# Patient Record
Sex: Male | Born: 1950 | Race: White | Hispanic: No | Marital: Married | Smoking: Never smoker
Health system: Southern US, Community
[De-identification: ages and names within clinical notes are randomized; demographics above are authoritative.]

## PROBLEM LIST (undated history)

## (undated) DIAGNOSIS — Z8719 Personal history of other diseases of the digestive system: Secondary | ICD-10-CM

## (undated) DIAGNOSIS — R569 Unspecified convulsions: Secondary | ICD-10-CM

## (undated) DIAGNOSIS — S4290XA Fracture of unspecified shoulder girdle, part unspecified, initial encounter for closed fracture: Secondary | ICD-10-CM

## (undated) DIAGNOSIS — R7611 Nonspecific reaction to tuberculin skin test without active tuberculosis: Secondary | ICD-10-CM

## (undated) DIAGNOSIS — K5903 Drug induced constipation: Secondary | ICD-10-CM

## (undated) DIAGNOSIS — N400 Enlarged prostate without lower urinary tract symptoms: Secondary | ICD-10-CM

## (undated) DIAGNOSIS — K56609 Unspecified intestinal obstruction, unspecified as to partial versus complete obstruction: Secondary | ICD-10-CM

## (undated) DIAGNOSIS — F4310 Post-traumatic stress disorder, unspecified: Secondary | ICD-10-CM

## (undated) DIAGNOSIS — R39198 Other difficulties with micturition: Secondary | ICD-10-CM

## (undated) DIAGNOSIS — G8929 Other chronic pain: Secondary | ICD-10-CM

## (undated) DIAGNOSIS — G43909 Migraine, unspecified, not intractable, without status migrainosus: Secondary | ICD-10-CM

## (undated) DIAGNOSIS — R1084 Generalized abdominal pain: Secondary | ICD-10-CM

## (undated) DIAGNOSIS — F0390 Unspecified dementia without behavioral disturbance: Secondary | ICD-10-CM

## (undated) DIAGNOSIS — K219 Gastro-esophageal reflux disease without esophagitis: Secondary | ICD-10-CM

## (undated) DIAGNOSIS — G479 Sleep disorder, unspecified: Secondary | ICD-10-CM

## (undated) HISTORY — PX: HERNIA REPAIR: SHX51

## (undated) HISTORY — PX: COLECTOMY: SHX59

## (undated) HISTORY — PX: BOWEL RESECTION: SHX1257

## (undated) HISTORY — PX: EXPLORATORY LAPAROTOMY: SUR591

## (undated) HISTORY — PX: EXCISION OF MESH: SHX6268

## (undated) HISTORY — PX: VENTRAL HERNIA REPAIR: SHX424

---
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
## 1969-09-20 HISTORY — PX: OTHER SURGICAL HISTORY: SHX169

## 1969-12-19 HISTORY — PX: COLOSTOMY: SHX63

## 1970-07-21 HISTORY — PX: COLOSTOMY TAKEDOWN: SHX5783

## 1970-09-20 DIAGNOSIS — R569 Unspecified convulsions: Secondary | ICD-10-CM

## 1970-09-20 HISTORY — DX: Unspecified convulsions: R56.9

## 2003-01-25 ENCOUNTER — Encounter: Payer: Self-pay | Admitting: Emergency Medicine

## 2003-01-25 ENCOUNTER — Emergency Department (HOSPITAL_COMMUNITY): Admission: EM | Admit: 2003-01-25 | Discharge: 2003-01-25 | Payer: Self-pay | Admitting: Emergency Medicine

## 2008-09-20 DIAGNOSIS — S4290XA Fracture of unspecified shoulder girdle, part unspecified, initial encounter for closed fracture: Secondary | ICD-10-CM

## 2008-09-20 HISTORY — DX: Fracture of unspecified shoulder girdle, part unspecified, initial encounter for closed fracture: S42.90XA

## 2014-05-27 ENCOUNTER — Encounter (HOSPITAL_COMMUNITY): Payer: Self-pay | Admitting: Emergency Medicine

## 2014-05-27 ENCOUNTER — Inpatient Hospital Stay (HOSPITAL_COMMUNITY)
Admission: EM | Admit: 2014-05-27 | Discharge: 2014-05-31 | DRG: 388 | Disposition: A | Discharge status: Home / Self Care | Payer: Non-veteran care | Attending: Family Medicine | Admitting: Family Medicine

## 2014-05-27 ENCOUNTER — Emergency Department (HOSPITAL_COMMUNITY): Payer: Non-veteran care

## 2014-05-27 DIAGNOSIS — G4700 Insomnia, unspecified: Secondary | ICD-10-CM | POA: Diagnosis present

## 2014-05-27 DIAGNOSIS — K5900 Constipation, unspecified: Secondary | ICD-10-CM | POA: Diagnosis present

## 2014-05-27 DIAGNOSIS — IMO0002 Reserved for concepts with insufficient information to code with codable children: Secondary | ICD-10-CM

## 2014-05-27 DIAGNOSIS — F4310 Post-traumatic stress disorder, unspecified: Secondary | ICD-10-CM

## 2014-05-27 DIAGNOSIS — E43 Unspecified severe protein-calorie malnutrition: Secondary | ICD-10-CM | POA: Insufficient documentation

## 2014-05-27 DIAGNOSIS — K56600 Partial intestinal obstruction, unspecified as to cause: Secondary | ICD-10-CM | POA: Diagnosis present

## 2014-05-27 DIAGNOSIS — K560 Paralytic ileus: Secondary | ICD-10-CM | POA: Diagnosis present

## 2014-05-27 DIAGNOSIS — G8929 Other chronic pain: Secondary | ICD-10-CM | POA: Diagnosis present

## 2014-05-27 DIAGNOSIS — R109 Unspecified abdominal pain: Secondary | ICD-10-CM

## 2014-05-27 DIAGNOSIS — Z7982 Long term (current) use of aspirin: Secondary | ICD-10-CM

## 2014-05-27 DIAGNOSIS — Z79899 Other long term (current) drug therapy: Secondary | ICD-10-CM

## 2014-05-27 DIAGNOSIS — N400 Enlarged prostate without lower urinary tract symptoms: Secondary | ICD-10-CM | POA: Diagnosis present

## 2014-05-27 HISTORY — DX: Post-traumatic stress disorder, unspecified: F43.10

## 2014-05-27 HISTORY — DX: Other difficulties with micturition: R39.198

## 2014-05-27 HISTORY — DX: Sleep disorder, unspecified: G47.9

## 2014-05-27 HISTORY — DX: Generalized abdominal pain: R10.84

## 2014-05-27 HISTORY — DX: Nonspecific reaction to tuberculin skin test without active tuberculosis: R76.11

## 2014-05-27 HISTORY — DX: Benign prostatic hyperplasia without lower urinary tract symptoms: N40.0

## 2014-05-27 HISTORY — DX: Fracture of unspecified shoulder girdle, part unspecified, initial encounter for closed fracture: S42.90XA

## 2014-05-27 HISTORY — DX: Other chronic pain: G89.29

## 2014-05-27 HISTORY — DX: Migraine, unspecified, not intractable, without status migrainosus: G43.909

## 2014-05-27 HISTORY — DX: Gastro-esophageal reflux disease without esophagitis: K21.9

## 2014-05-27 HISTORY — DX: Personal history of other diseases of the digestive system: Z87.19

## 2014-05-27 HISTORY — DX: Unspecified intestinal obstruction, unspecified as to partial versus complete obstruction: K56.609

## 2014-05-27 LAB — COMPREHENSIVE METABOLIC PANEL
ALK PHOS: 66 U/L (ref 39–117)
ALT: 8 U/L (ref 0–53)
AST: 17 U/L (ref 0–37)
Albumin: 3.4 g/dL — ABNORMAL LOW (ref 3.5–5.2)
Anion gap: 13 (ref 5–15)
BUN: 12 mg/dL (ref 6–23)
CALCIUM: 9.2 mg/dL (ref 8.4–10.5)
CO2: 25 meq/L (ref 19–32)
Chloride: 102 mEq/L (ref 96–112)
Creatinine, Ser: 1.19 mg/dL (ref 0.50–1.35)
GFR calc non Af Amer: 63 mL/min — ABNORMAL LOW (ref >90)
GFR, EST AFRICAN AMERICAN: 73 mL/min — AB (ref >90)
GLUCOSE: 112 mg/dL — AB (ref 70–99)
POTASSIUM: 4.6 meq/L (ref 3.7–5.3)
SODIUM: 140 meq/L (ref 137–147)
TOTAL PROTEIN: 6.9 g/dL (ref 6.0–8.3)
Total Bilirubin: 0.3 mg/dL (ref 0.3–1.2)

## 2014-05-27 LAB — CBC WITH DIFFERENTIAL/PLATELET
BASOS ABS: 0 10*3/uL (ref 0.0–0.1)
Basophils Relative: 0 % (ref 0–1)
EOS ABS: 0.2 10*3/uL (ref 0.0–0.7)
Eosinophils Relative: 1 % (ref 0–5)
HCT: 41.9 % (ref 39.0–52.0)
Hemoglobin: 14.2 g/dL (ref 13.0–17.0)
LYMPHS ABS: 2.2 10*3/uL (ref 0.7–4.0)
LYMPHS PCT: 16 % (ref 12–46)
MCH: 29.5 pg (ref 26.0–34.0)
MCHC: 33.9 g/dL (ref 30.0–36.0)
MCV: 86.9 fL (ref 78.0–100.0)
Monocytes Absolute: 0.8 10*3/uL (ref 0.1–1.0)
Monocytes Relative: 6 % (ref 3–12)
NEUTROS PCT: 77 % (ref 43–77)
Neutro Abs: 10.7 10*3/uL — ABNORMAL HIGH (ref 1.7–7.7)
PLATELETS: 183 10*3/uL (ref 150–400)
RBC: 4.82 MIL/uL (ref 4.22–5.81)
RDW: 13.5 % (ref 11.5–15.5)
WBC: 13.8 10*3/uL — AB (ref 4.0–10.5)

## 2014-05-27 LAB — LIPASE, BLOOD: Lipase: 21 U/L (ref 11–59)

## 2014-05-27 LAB — URINALYSIS, ROUTINE W REFLEX MICROSCOPIC
BILIRUBIN URINE: NEGATIVE
GLUCOSE, UA: NEGATIVE mg/dL
HGB URINE DIPSTICK: NEGATIVE
Ketones, ur: NEGATIVE mg/dL
Leukocytes, UA: NEGATIVE
Nitrite: NEGATIVE
Protein, ur: NEGATIVE mg/dL
SPECIFIC GRAVITY, URINE: 1.03 (ref 1.005–1.030)
UROBILINOGEN UA: 0.2 mg/dL (ref 0.0–1.0)
pH: 5.5 (ref 5.0–8.0)

## 2014-05-27 MED ORDER — HYDROMORPHONE HCL PF 1 MG/ML IJ SOLN
2.0000 mg | INTRAMUSCULAR | Status: DC | PRN
  Administered 2014-05-27 – 2014-05-29 (×13): 2 mg via INTRAVENOUS
  Filled 2014-05-27 (×13): qty 2

## 2014-05-27 MED ORDER — ONDANSETRON HCL 4 MG/2ML IJ SOLN
4.0000 mg | Freq: Four times a day (QID) | INTRAMUSCULAR | Status: DC | PRN
Start: 2014-05-27 — End: 2014-05-31
  Administered 2014-05-28 – 2014-05-30 (×4): 4 mg via INTRAVENOUS
  Filled 2014-05-27 (×4): qty 2

## 2014-05-27 MED ORDER — ONDANSETRON HCL 4 MG/2ML IJ SOLN
4.0000 mg | Freq: Once | INTRAMUSCULAR | Status: AC
  Administered 2014-05-27: 4 mg via INTRAVENOUS
  Filled 2014-05-27: qty 2

## 2014-05-27 MED ORDER — SODIUM CHLORIDE 0.9 % IV BOLUS (SEPSIS)
1000.0000 mL | Freq: Once | INTRAVENOUS | Status: AC
  Administered 2014-05-27: 1000 mL via INTRAVENOUS

## 2014-05-27 MED ORDER — ENOXAPARIN SODIUM 40 MG/0.4ML ~~LOC~~ SOLN
40.0000 mg | SUBCUTANEOUS | Status: DC
  Administered 2014-05-27 – 2014-05-30 (×4): 40 mg via SUBCUTANEOUS
  Filled 2014-05-27 (×5): qty 0.4

## 2014-05-27 MED ORDER — SODIUM CHLORIDE 0.9 % IV SOLN
INTRAVENOUS | Status: AC
  Administered 2014-05-27: 17:00:00 via INTRAVENOUS

## 2014-05-27 MED ORDER — HYDROMORPHONE HCL PF 1 MG/ML IJ SOLN
1.0000 mg | Freq: Once | INTRAMUSCULAR | Status: AC
  Administered 2014-05-27: 1 mg via INTRAVENOUS
  Filled 2014-05-27: qty 1

## 2014-05-27 MED ORDER — ONDANSETRON 8 MG/NS 50 ML IVPB: 8.0000 mg | Freq: Once | INTRAVENOUS | Status: DC

## 2014-05-27 MED ORDER — IOHEXOL 300 MG/ML  SOLN
100.0000 mL | Freq: Once | INTRAMUSCULAR | Status: AC | PRN
  Administered 2014-05-27: 100 mL via INTRAVENOUS

## 2014-05-27 MED ORDER — DOXAZOSIN MESYLATE 8 MG PO TABS
8.0000 mg | ORAL_TABLET | Freq: Every day | ORAL | Status: DC
  Administered 2014-05-27 – 2014-05-30 (×4): 8 mg via NASOGASTRIC
  Filled 2014-05-27 (×5): qty 1

## 2014-05-27 MED ORDER — CLONAZEPAM 1 MG PO TABS
1.0000 mg | ORAL_TABLET | Freq: Every day | ORAL | Status: DC
  Administered 2014-05-27 – 2014-05-30 (×4): 1 mg via NASOGASTRIC
  Filled 2014-05-27 (×4): qty 1

## 2014-05-27 MED ORDER — PANTOPRAZOLE SODIUM 40 MG IV SOLR
40.0000 mg | INTRAVENOUS | Status: DC
  Administered 2014-05-27 – 2014-05-30 (×4): 40 mg via INTRAVENOUS
  Filled 2014-05-27 (×5): qty 40

## 2014-05-27 MED ORDER — HYDROMORPHONE HCL PF 1 MG/ML IJ SOLN: 1.0000 mg | INTRAMUSCULAR | Status: DC | PRN

## 2014-05-27 MED ORDER — DEXTROSE-NACL 5-0.9 % IV SOLN
INTRAVENOUS | Status: DC
  Administered 2014-05-27 – 2014-05-31 (×8): via INTRAVENOUS

## 2014-05-27 MED ORDER — CLONAZEPAM 1 MG PO TABS
2.0000 mg | ORAL_TABLET | Freq: Every day | ORAL | Status: DC
  Administered 2014-05-28 – 2014-05-31 (×4): 2 mg via NASOGASTRIC
  Filled 2014-05-27 (×4): qty 2

## 2014-05-27 NOTE — ED Notes (Signed)
Tried cathing but met resistance patient states this is an ongoing problem at home also

## 2014-05-27 NOTE — ED Notes (Addendum)
Surgery at bedside.

## 2014-05-27 NOTE — ED Notes (Signed)
Dr. Micheline Maze at bedside to talk with pt and family about plan of care.

## 2014-05-27 NOTE — Consult Note (Signed)
Bahar Shelden K. Crue Otero, MD, [facility]  General/ Trauma Surgery  05/27/2014 4:34 PM
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
## 2014-05-27 NOTE — ED Notes (Signed)
Pt vomiting on the floor at this time. Complaining of lower abd pain and cramping.

## 2014-05-27 NOTE — ED Notes (Signed)
Per EMS pt has chronic abdominal pain and has had multiple surgeries. Pt usually takes methadone at home for pain, he took 2 tablets this morning with no relief. Pt states the pain is a 10/10. Pt crying stating his pain. Pt has also been nauseous and vomiting. Vital signs stable with EMS.

## 2014-05-28 ENCOUNTER — Inpatient Hospital Stay (HOSPITAL_COMMUNITY): Payer: Non-veteran care

## 2014-05-28 LAB — BASIC METABOLIC PANEL
Anion gap: 11 (ref 5–15)
BUN: 11 mg/dL (ref 6–23)
CO2: 26 mEq/L (ref 19–32)
Calcium: 8.3 mg/dL — ABNORMAL LOW (ref 8.4–10.5)
Chloride: 102 mEq/L (ref 96–112)
Creatinine, Ser: 1.11 mg/dL (ref 0.50–1.35)
GFR calc Af Amer: 80 mL/min — ABNORMAL LOW (ref >90)
GFR, EST NON AFRICAN AMERICAN: 69 mL/min — AB (ref >90)
GLUCOSE: 140 mg/dL — AB (ref 70–99)
POTASSIUM: 4.2 meq/L (ref 3.7–5.3)
Sodium: 139 mEq/L (ref 137–147)

## 2014-05-28 LAB — CBC
HCT: 37.7 % — ABNORMAL LOW (ref 39.0–52.0)
Hemoglobin: 12.5 g/dL — ABNORMAL LOW (ref 13.0–17.0)
MCH: 28.9 pg (ref 26.0–34.0)
MCHC: 33.2 g/dL (ref 30.0–36.0)
MCV: 87.3 fL (ref 78.0–100.0)
PLATELETS: 157 10*3/uL (ref 150–400)
RBC: 4.32 MIL/uL (ref 4.22–5.81)
RDW: 13.6 % (ref 11.5–15.5)
WBC: 7.9 10*3/uL (ref 4.0–10.5)

## 2014-05-28 LAB — URINE CULTURE
COLONY COUNT: NO GROWTH
CULTURE: NO GROWTH
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
## 2014-05-28 NOTE — Progress Notes (Signed)
FMTS Attending Daily Note:  Renold Don MD  [phone] pager  Family Practice pager:  [phone] I have discussed this patient with the resident Dr. Caroleen Hamman and attending physician Dr. Cristal Ford.  I agree with their findings, assessment, and care plan

## 2014-05-28 NOTE — Progress Notes (Signed)
Family Medicine Teaching Service Daily Progress Note Intern Pager: [phone]  Patient name: Jonathon Turner Medical record number: [id number] Date of birth: [birth year] Age: 63 y.o. Gender: male  Primary Care Provider: Pcp Not In System Consultants: Surgery Code Status: FULL  Pt Overview and Major Events to Date:  9/7: Admitted to FPTS  Assessment and Plan: Jonathon Turner is a 63 y.o. male presenting with severe abdominal pain and concern for small bowel ostruction. PMH is significant for PTSD, hx of bowel obstruction and multiple abdominal surgeries, chronic abdominal pain (on Methadone), insomnia, and BPH.   #Severe Abdominal Pain:  DDx includes ileus vs. small bowel obstruction vs. Dysmotility secondary to pain medications.  H/O numerous abdominal surgeries secondary to prior obstructions.  - CT- distended/inflamed loops of small bowel and stool like appearance of small bowel contents consistent with small bowel obstruction/malabsorption; gas and stool throughout entire colon - General surgery consulted- recommend no acute surgical interventions at this time - IV fluids - D5 NS @ 100 mL/hr  - NPO, NG in place for gastric decompression  - Dilaudid 2 mg Q2PRN for pain  -Confirm home dose of methadone - Holding home gabapentin and Elavil to limit medications passing through the gut.  -Attempt to obtain past medical records from [place] in [place]   #PTSD, Insomnia  - Holding home Ambien; will monitor closely and administer if needed.  - Continuing home Klonopin (per NG)   #BPH  - Home Cardura per NG   FEN/GI: NPO, D5 NS @ 100 mL/hr  Prophylaxis: Lovenox  Disposition: Admitted to [facility] Medicine Teaching Service.  Subjective:  No acute complaints overnight.  Vomited this morning and CXR showed NG tube should be advanced 15cm; NG was advanced by nursing shortly after.  States pain is improved since yesterday, but is still a 9.5/10.  Pain is diffuse.  Denies any problems with urination,  constipation, or changes in appetite.  No further complaints today.  Objective: Temp:  [97.9 F (36.6 C)-98.2 F (36.8 C)] 98.2 F (36.8 C) (09/08 0540) Pulse Rate:  [69-112] 75 (09/08 0540) Resp:  [14-24] 19 (09/08 0540) BP: (109-150)/(65-88) 119/69 mmHg (09/08 0540) SpO2:  [92 %-100 %] 98 % (09/08 0540) Weight:  [122 lb 9.6 oz (55.611 kg)] 122 lb 9.6 oz (55.611 kg) (09/07 1802) Physical Exam: General: 62yo male resting comfortably in no apparent distress Cardiovascular: S1 and S2 noted. Systolic murmur noted. Regular rate and rhythm Respiratory: Clear to auscultation bilaterally. No increased work of breathing. Abdomen: Bowel sounds noted.  Diffuse tenderness.  No rebound tenderness noted. Extremities: No edema noted  Laboratory:  Recent Labs Lab 05/27/14 1030 05/28/14 0522  WBC 13.8* 7.9  HGB 14.2 12.5*  HCT 41.9 37.7*  PLT 183 157    Recent Labs Lab 05/27/14 1030 05/28/14 0522  NA 140 139  K 4.6 4.2  CL 102 102  CO2 25 26  BUN 12 11  CREATININE 1.19 1.11  CALCIUM 9.2 8.3*  PROT 6.9  --   BILITOT 0.3  --   ALKPHOS 66  --   ALT 8  --   AST 17  --   GLUCOSE 112* 140*   UA- clear Urine Culture- Pending  Imaging/Diagnostic Tests: 9/7:  CT Abdomen/Pelvis Markedly abnormal appearance of the small bowel loops which are distended and inflamed with small amount of free fluid in the pelvis and abdomen. Stool like appearance of small bowel contents consistent with small bowel obstruction/malabsorption.   [place], DO 05/28/2014, 8:36  AM PGY-1, [place] Intern pager: [phone], text pages welcome

## 2014-05-28 NOTE — H&P (Signed)
FMTS Attending Note  I personally saw and evaluated the patient. The plan of care was discussed with the resident team. I agree with the assessment and plan as documented by the resident.   63 y/o male with PMH PTSD, multiple abdominal surgeries, history of SBO, chronic abdominal pain on methadone, and BPH presents with 2 week history of worsening abdominal pain, he has associated non-bloody emesis, last BM was 3 days ago, he has continued to pass flatus, he follows at the [place] in [place], please refer to resident note for additional HPI.  Vitals: reviewed Gen: pleasant male, laying in bed, mild distress from pain HEENT: normocephalic, PERRL, EOMI, no scleral icterus, dry mucous membranes Cardiac: 3/6 systolic murmur heard best at left 5th intercostal space mid clavicular and right second intercostal space, no heaves/thrills Resp: CTAB, normal effort Abd: multiple abdominal scars, largest is midline, no distension, bowel sounds present in all quadrants, diffusely tender, mild rebound, pain elicited with peritoneal provacation Ext: no edema, 2+ radial and DP pulses  Reviewed lab work and CT Abd/Pelvis  Assessment and plan: 63 year-old male admitted with abdominal pain secondary to SBO versus ileus 1. SBO versus ileus-secondary to history of multiple bowel surgeries as well as chronic opioid use, surgery has evaluated the patient and plans for no acute surgical interventions, continue pain control, IV fluid resuscitation, bowel rest, NG tube 2. Chronic pain-patient uncertain of home methadone dose, hold methadone as receiving IV dilauded 3. PTSD - agree with continuing Klonopin through NGT 4. BPH - continue home Cadura 5. Systolic heart murmur - consistent with mitral valve vs aortic valve stenosis, does not appear to be volume overloaded or having any acute cardiac signs/symptoms, attempt to get record from [place]  Donnella Sham MD

## 2014-05-28 NOTE — Progress Notes (Signed)
I have seen and examined the patient and agree with the assessment and plans. Will continue conservative management.  Would be very difficult surgery if it is even needed. Cale Bethard A. Magnus Ivan  MD, FACS

## 2014-05-29 ENCOUNTER — Inpatient Hospital Stay (HOSPITAL_COMMUNITY): Payer: Non-veteran care

## 2014-05-29 DIAGNOSIS — E43 Unspecified severe protein-calorie malnutrition: Secondary | ICD-10-CM | POA: Insufficient documentation

## 2014-05-29 MED ORDER — METHADONE HCL 10 MG PO TABS: 5.0000 mg | ORAL_TABLET | Freq: Three times a day (TID) | ORAL | Status: DC

## 2014-05-29 MED ORDER — DOCUSATE SODIUM 50 MG/5ML PO LIQD
100.0000 mg | Freq: Two times a day (BID) | ORAL | Status: DC
  Administered 2014-05-29 – 2014-05-31 (×4): 100 mg via ORAL
  Filled 2014-05-29 (×6): qty 10

## 2014-05-29 MED ORDER — DOCUSATE SODIUM 100 MG PO CAPS
100.0000 mg | ORAL_CAPSULE | Freq: Two times a day (BID) | ORAL | Status: DC
  Administered 2014-05-29: 100 mg via ORAL

## 2014-05-29 MED ORDER — MILK AND MOLASSES ENEMA
1.0000 | Freq: Once | RECTAL | Status: DC
  Filled 2014-05-29: qty 250

## 2014-05-29 MED ORDER — HYDROMORPHONE HCL PF 1 MG/ML IJ SOLN
2.0000 mg | INTRAMUSCULAR | Status: DC | PRN
  Administered 2014-05-29 – 2014-05-30 (×5): 2 mg via INTRAVENOUS
  Filled 2014-05-29 (×5): qty 2

## 2014-05-29 MED ORDER — METHADONE HCL 10 MG PO TABS
5.0000 mg | ORAL_TABLET | Freq: Three times a day (TID) | ORAL | Status: DC
  Administered 2014-05-29 – 2014-05-31 (×7): 5 mg via NASOGASTRIC
  Filled 2014-05-29 (×7): qty 1

## 2014-05-29 MED ORDER — DOCUSATE SODIUM 50 MG/5ML PO LIQD: 200.0000 mg | Freq: Two times a day (BID) | ORAL | Status: DC | PRN

## 2014-05-29 NOTE — Progress Notes (Signed)
I have seen and examined the patient and agree with the assessment and plans. Continuing current care.  No plans for surgery at this point.  Deaun Rocha A. Magnus Ivan  MD, FACS

## 2014-05-29 NOTE — Progress Notes (Signed)
Chart and note reviewed. Agree with note.  Adriena Manfre, RD, LDN, CNSC Pager [phone] After Hours Pager [phone]   

## 2014-05-29 NOTE — Progress Notes (Signed)
Patient ID: Jonathon Turner, male   DOB: 1951/09/17, 63 y.o.   MRN: [id number]     [facility]      [address] [place], [place] [address]    Phone: [phone] FAX: [phone]     Subjective: Passing flatus.  No BM.  No n/v.  968m/24 NGT.    Objective:  Vital signs:  Filed Vitals:   05/28/14 0540 05/28/14 1400 05/28/14 2146 05/29/14 0504  BP: 119/69 136/69 159/67 149/78  Pulse: 75 77 77 76  Temp: 98.2 F (36.8 C) 98.1 F (36.7 C) 98 F (36.7 C) 98.2 F (36.8 C)  TempSrc: Oral Oral Oral   Resp: 19 18 18 16   Height:      Weight:      SpO2: 98% 97% 96% 97%    Last BM Date: 05/25/14  Intake/Output   Yesterday:  09/08 0701 - 09/09 0700 In: 2177 [I.V.:2027; NG/GT:150] Out: 1600 [Urine:650; Emesis/NG output:950] This shift:    I/O last 3 completed shifts: In: 3295.3 [I.V.:3115.3; NG/GT:180] Out: 2075 [Urine:1075; Emesis/NG output:1000]    Physical Exam:  General: Pt awake/alert/oriented x4 in no acute distress  Abdomen: Soft. Nondistended. +BS.  Non tender.  No evidence of peritonitis. No incarcerated hernias.  Problem List:   Principal Problem:   Partial small bowel obstruction Active Problems:   Chronic abdominal pain   PTSD (post-traumatic stress disorder)   Insomnia   BPH (benign prostatic hyperplasia)    Results:   Labs: Results for orders placed during the hospital encounter of 05/27/14 (from the past 48 hour(s))  CBC WITH DIFFERENTIAL     Status: Abnormal   Collection Time    05/27/14 10:30 AM      Result Value Ref Range   WBC 13.8 (*) 4.0 - 10.5 K/uL   RBC 4.82  4.22 - 5.81 MIL/uL   Hemoglobin 14.2  13.0 - 17.0 g/dL   HCT 41.9  39.0 - 52.0 %   MCV 86.9  78.0 - 100.0 fL   MCH 29.5  26.0 - 34.0 pg   MCHC 33.9  30.0 - 36.0 g/dL   RDW 13.5  11.5 - 15.5 %   Platelets 183  150 - 400 K/uL   Neutrophils Relative % 77  43 - 77 %   Neutro Abs 10.7 (*) 1.7 - 7.7 K/uL   Lymphocytes Relative 16  12  - 46 %   Lymphs Abs 2.2  0.7 - 4.0 K/uL   Monocytes Relative 6  3 - 12 %   Monocytes Absolute 0.8  0.1 - 1.0 K/uL   Eosinophils Relative 1  0 - 5 %   Eosinophils Absolute 0.2  0.0 - 0.7 K/uL   Basophils Relative 0  0 - 1 %   Basophils Absolute 0.0  0.0 - 0.1 K/uL  COMPREHENSIVE METABOLIC PANEL     Status: Abnormal   Collection Time    05/27/14 10:30 AM      Result Value Ref Range   Sodium 140  137 - 147 mEq/L   Potassium 4.6  3.7 - 5.3 mEq/L   Chloride 102  96 - 112 mEq/L   CO2 25  19 - 32 mEq/L   Glucose, Bld 112 (*) 70 - 99 mg/dL   BUN 12  6 - 23 mg/dL   Creatinine, Ser 1.19  0.50 - 1.35 mg/dL   Calcium 9.2  8.4 - 10.5 mg/dL   Total Protein 6.9  6.0 - 8.3  g/dL   Albumin 3.4 (*) 3.5 - 5.2 g/dL   AST 17  0 - 37 U/L   ALT 8  0 - 53 U/L   Alkaline Phosphatase 66  39 - 117 U/L   Total Bilirubin 0.3  0.3 - 1.2 mg/dL   GFR calc non Af Amer 63 (*) >90 mL/min   GFR calc Af Amer 73 (*) >90 mL/min   Comment: (NOTE)     The eGFR has been calculated using the CKD EPI equation.     This calculation has not been validated in all clinical situations.     eGFR's persistently <90 mL/min signify possible Chronic Kidney     Disease.   Anion gap 13  5 - 15  LIPASE, BLOOD     Status: None   Collection Time    05/27/14 10:30 AM      Result Value Ref Range   Lipase 21  11 - 59 U/L  URINALYSIS, ROUTINE W REFLEX MICROSCOPIC     Status: None   Collection Time    05/27/14  2:10 PM      Result Value Ref Range   Color, Urine YELLOW  YELLOW   APPearance CLEAR  CLEAR   Specific Gravity, Urine 1.030  1.005 - 1.030   pH 5.5  5.0 - 8.0   Glucose, UA NEGATIVE  NEGATIVE mg/dL   Hgb urine dipstick NEGATIVE  NEGATIVE   Bilirubin Urine NEGATIVE  NEGATIVE   Ketones, ur NEGATIVE  NEGATIVE mg/dL   Protein, ur NEGATIVE  NEGATIVE mg/dL   Urobilinogen, UA 0.2  0.0 - 1.0 mg/dL   Nitrite NEGATIVE  NEGATIVE   Leukocytes, UA NEGATIVE  NEGATIVE   Comment: MICROSCOPIC NOT DONE ON URINES WITH NEGATIVE PROTEIN,  BLOOD, LEUKOCYTES, NITRITE, OR GLUCOSE <1000 mg/dL.  URINE CULTURE     Status: None   Collection Time    05/27/14  2:10 PM      Result Value Ref Range   Specimen Description URINE, CLEAN CATCH     Special Requests NONE     Culture  Setup Time       Value: 05/26/2014 14:54     Performed at [facility] Count       Value: NO GROWTH     Performed at [facility]   Culture       Value: NO GROWTH     Performed at [facility]   Report Status 05/28/2014 FINAL    BASIC METABOLIC PANEL     Status: Abnormal   Collection Time    05/28/14  5:22 AM      Result Value Ref Range   Sodium 139  137 - 147 mEq/L   Potassium 4.2  3.7 - 5.3 mEq/L   Chloride 102  96 - 112 mEq/L   CO2 26  19 - 32 mEq/L   Glucose, Bld 140 (*) 70 - 99 mg/dL   BUN 11  6 - 23 mg/dL   Creatinine, Ser 1.11  0.50 - 1.35 mg/dL   Calcium 8.3 (*) 8.4 - 10.5 mg/dL   GFR calc non Af Amer 69 (*) >90 mL/min   GFR calc Af Amer 80 (*) >90 mL/min   Comment: (NOTE)     The eGFR has been calculated using the CKD EPI equation.     This calculation has not been validated in all clinical situations.     eGFR's persistently <90 mL/min signify possible Chronic Kidney  Disease.   Anion gap 11  5 - 15  CBC     Status: Abnormal   Collection Time    05/28/14  5:22 AM      Result Value Ref Range   WBC 7.9  4.0 - 10.5 K/uL   RBC 4.32  4.22 - 5.81 MIL/uL   Hemoglobin 12.5 (*) 13.0 - 17.0 g/dL   HCT 37.7 (*) 39.0 - 52.0 %   MCV 87.3  78.0 - 100.0 fL   MCH 28.9  26.0 - 34.0 pg   MCHC 33.2  30.0 - 36.0 g/dL   RDW 13.6  11.5 - 15.5 %   Platelets 157  150 - 400 K/uL    Imaging / Studies: Ct Abdomen Pelvis W Contrast  05/27/2014   CLINICAL DATA:  Severe mid abdominal pain with nausea. Multiple prior surgeries. History of small bowel obstruction.  EXAM: CT ABDOMEN AND PELVIS WITH CONTRAST  TECHNIQUE: Multidetector CT imaging of the abdomen and pelvis was performed using the standard protocol following  bolus administration of intravenous contrast.  CONTRAST:  138m OMNIPAQUE IOHEXOL 300 MG/ML  SOLN  COMPARISON:  None.  FINDINGS: Prior surgeries with diastases of rectus muscles. Surgical clips right colon with resection of portion right colon and small bowel.  Markedly abnormal appearance of the small bowel loops which are distended and inflamed with small amount of free fluid in the pelvis and abdomen. This may be related to adhesions. Stool like appearance of small bowel contents consistent with small bowel obstruction/malabsorption.  Gas distended stomach. Patient may benefit from nasogastric tube decompression.  Appendix not visualized and may have been surgically resected.  No bowel containing hernia.  1.5 cm rounded low-density structure subcutaneous fat left lower abdominal wall adjacent to fatty containing hernia may represent residua of prior inflammation. Small subcutaneous abscess not entirely excluded.  No free intraperitoneal air.  Atherosclerotic type changes of the abdominal aorta without aneurysmal dilation or high-grade stenosis. Mild narrowing celiac artery origin and moderate narrowing inferior mesenteric artery origin. Moderate narrowing right common iliac artery.  No worrisome hepatic, splenic, pancreatic, adrenal or renal lesion. Inferior pole right renal 2.5 cm cyst. Kidneys appear slightly atrophic.  No calcified gallstone.  Noncontrast filled imaging the urinary bladder without abnormality noted. Small calcifications within the prostate gland.  No bony destructive lesion.  No adenopathy.  Basilar subsegmental atelectasis versus scarring.  IMPRESSION: Prior surgeries with diastases of rectus muscles. Surgical clips right colon with resection of portion right colon and small bowel.  Markedly abnormal appearance of the small bowel loops which are distended and inflamed with small amount of free fluid in the pelvis and abdomen. This may be related to adhesions. Stool like appearance of small  bowel contents consistent with small bowel obstruction/malabsorption.  Gas distended stomach. Patient may benefit from nasogastric tube decompression.  1.5 cm rounded low-density structure subcutaneous fat left lower abdominal wall may represent residua of prior inflammation. Small subcutaneous abscess not entirely excluded.  No free intraperitoneal air.  Atherosclerotic type changes with narrowing most notable right common iliac artery.  Please see above for further detail   Electronically Signed   By: SChauncey CruelM.D.   On: 05/27/2014 13:53   Dg Abd Portable 1v  05/28/2014   CLINICAL DATA:  Vomiting, NG tube placement  EXAM: PORTABLE ABDOMEN - 1 VIEW  COMPARISON:  05/27/2014  FINDINGS: There is a nasogastric tube with the tip 4 cm above the gastroesophageal junction. Recommend advancing the nasogastric tube approximately 15  cm.  There is gaseous distension of the stomach. There are dilated loops of small bowel in the left-sided abdomen. There is air seen within the colon. There are surgical colonic anastomotic sutures in the right lower quadrant. There is no evidence of pneumoperitoneum, portal venous gas or pneumatosis. There are no pathologic calcifications along the expected course of the ureters.The osseous structures are unremarkable.  IMPRESSION: There is a nasogastric tube with the tip 4 cm above the gastroesophageal junction. Recommend advancing the nasogastric tube approximately 15 cm.  Mild gaseous distension of the stomach and small bowel loops in the left side of the abdomen with air seen within the colon. This may reflect an ileus versus partial small bowel obstruction. Continued radiographic follow-up recommended.   Electronically Signed   By: Kathreen Devoid   On: 05/28/2014 08:41   Dg Abd Portable 1v  05/27/2014   CLINICAL DATA:  Abdominal pain.  Multiple previous surgeries.  EXAM: PORTABLE ABDOMEN - 1 VIEW  COMPARISON:  CT today.  FINDINGS: Exam demonstrates the nasogastric tube tip at the level  of the gastroesophageal junction in the left upper quadrant. There are multiple surgical clips over the right colon. There are a few mildly prominent air and stool-filled small bowel loops in the left abdomen as seen on the recent CT scan. Air is present within the stomach. Contrast is present within the bladder from patient's recent CT scan. There is a metallic BB projected over the right sacrum. There are degenerative changes of the spine.  IMPRESSION: Several air and stool-filled mildly prominent small bowel loops in the left abdomen as demonstrated on patient's recent CT scan which may be due to focal ileus versus early/ partial obstruction.  Nasogastric tube with tip at the level of the gastroesophageal junction. This could be advanced approximately 8-10 cm.   Electronically Signed   By: Marin Olp M.D.   On: 05/27/2014 15:12    Medications / Allergies:  Scheduled Meds: . clonazePAM  1 mg Per NG tube QHS  . clonazePAM  2 mg Per NG tube Daily  . doxazosin  8 mg Per NG tube QHS  . enoxaparin (LOVENOX) injection  40 mg Subcutaneous Q24H  . pantoprazole (PROTONIX) IV  40 mg Intravenous Q24H   Continuous Infusions: . dextrose 5 % and 0.9% NaCl 100 mL/hr at 05/29/14 0222   PRN Meds:.HYDROmorphone (DILAUDID) injection, ondansetron (ZOFRAN) IV  Antibiotics: Anti-infectives   None      Assessment/Plan  1. Chronic abdominal pain  2. Ileus vs PSBO  3. ? 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 PTSD  5. BPH   Repeat AXR, continue NGT for another day given increased output yesterday.  Abdomen is soft and much less tender on exam.  Will follow.    Erby Pian, [facility] Surgery Pager [phone]) For consults and floor pages call [phone](7A-4:30P)  05/29/2014 8:08 AM

## 2014-05-29 NOTE — Progress Notes (Signed)
Family Medicine Teaching Service Daily Progress Note Intern Pager: [phone]  Patient name: Jonathon Turner Medical record number: [id number] Date of birth: 06-06-1951 Age: 63 y.o. Gender: male  Primary Care Provider: Pcp Not In System Consultants: Surgery Code Status: FULL  Pt Overview and Major Events to Date:  9/7: Admitted to FPTS  Assessment and Plan: Jonathon Turner is a 63 y.o. male presenting with severe abdominal pain and concern for small bowel ostruction. PMH is significant for PTSD, hx of bowel obstruction and multiple abdominal surgeries, chronic abdominal pain (on Methadone), insomnia, and BPH.   #Severe Abdominal Pain Secondary to Constipation:  DDx includes ileus vs. small bowel obstruction vs. Dysmotility secondary to pain medications.  H/O numerous abdominal surgeries secondary to prior obstructions.  - CT- distended/inflamed loops of small bowel and stool like appearance of small bowel contents consistent with small bowel obstruction/malabsorption; gas and stool throughout entire colon -Abdominal Xray- no current evidence of obstruction - General surgery consulted- recommend no acute surgical interventions at this time - IV fluids - D5 NS @ 100 mL/hr  - NPO, NG in place for gastric decompression  - Methadone  TID with Dilaudid  q4hr PRN for breakthrough pain -Soap Water Enema given with bowel movement shortly thereafter -Colace  BID PRN -Zofran PRN nausea  -None received this morning.  Instructed nurse to give dose. - Holding home gabapentin and Elavil to limit medications passing through the gut.    #PTSD, Insomnia  - Holding home Ambien; will monitor closely and administer if needed.  - Continuing home Klonopin (per NG)   #BPH  - Home Cardura per NG   FEN/GI: NPO, D5 NS @ 100 mL/hr  Prophylaxis: Lovenox  Disposition: Admitted to [facility] Medicine Teaching Service.  Subjective:  No acute complaints overnight.  States his abdominal pain is about the  same and located in the same location.  He has not had a bowel movement in 3-4 days, stating he feels the urge to have a bowel movement but is not able to push.  Has severe headache this morning located in the frontal region, but Jonathon Turner states he has a h/o headaches and this feels similar to previous headaches; these are normally controlled by his methadone  TID.  Also complains of nausea and dizziness this morning.   Objective: Temp:  [98 F (36.7 C)-98.2 F (36.8 C)] 98.2 F (36.8 C) (09/09 0504) Pulse Rate:  [76-77] 76 (09/09 0504) Resp:  [16-18] 16 (09/09 0504) BP: (136-159)/(67-78) 149/78 mmHg (09/09 0504) SpO2:  [96 %-97 %] 97 % (09/09 0504) Physical Exam: General: 62yo male resting comfortably in moderate distress secondary to nausea and pain Cardiovascular: S1 and S2 noted. Systolic murmur noted. Regular rate and rhythm Respiratory: Clear to auscultation bilaterally. No increased work of breathing. Abdomen: Bowel sounds noted.  Diffuse tenderness.   Extremities: No edema noted  Laboratory:  Recent Labs Lab 05/27/14 1030 05/28/14 0522  WBC 13.8* 7.9  HGB 14.2 12.5*  HCT 41.9 37.7*  PLT 183 157    Recent Labs Lab 05/27/14 1030 05/28/14 0522  NA 140 139  K 4.6 4.2  CL 102 102  CO2 25 26  BUN 12 11  CREATININE 1.19 1.11  CALCIUM 9.2 8.3*  PROT 6.9  --   BILITOT 0.3  --   ALKPHOS 66  --   ALT 8  --   AST 17  --   GLUCOSE 112* 140*   UA- clear Urine Culture- Pending  Imaging/Diagnostic Tests: 9/7:  CT Abdomen/Pelvis Markedly abnormal appearance of the small bowel loops which are distended and inflamed with small amount of free fluid in the pelvis and abdomen. Stool like appearance of small bowel contents consistent with small bowel obstruction/malabsorption.   9/9 Abdominal Xray No current evidence of obstruction  Araceli Bouche, DO 05/29/2014, 9:50 AM PGY-1, [facility] Family Medicine FPTS Intern pager: [phone], text pages welcome

## 2014-05-29 NOTE — Progress Notes (Addendum)
FMTS Attending Daily Note:  Renold Don MD  [phone] pager  Family Practice pager:  [phone] I have seen and examined this patient and have reviewed their chart. I have discussed this patient with the resident. I agree with the resident's findings, assessment and care plan.  Additionally:  Late note:  Patient examined at 9:30 this AM:  - Patient initially seemed to be doing well.  As I continued to talk with him, began complaining of abdominal pain, headache, and nausea.  Had several episodes of dry heaving and spitting.  Some forced vomitus of minimal fluid. - On exam, abdomen soft with poor muscle tone.  No rigidity, rebound, or distension.   - xray shows resolution of obstruction.  Also shows fairly large, hard stool burden.  Feel strongly this plus dysmotility from narcotics is contributing to his pain.  Enema today.    - appreciate surgery input - can attempt pulling ng tube tomorrow and clears if does well overnight.   - Overall, he is improving.  Did not receive any PRN zofran.  Ordered 1 time dose after dry heaves.   - Restart home methadone and back down on his pain medications.   Tobey Grim, MD 05/29/2014 4:07 PM

## 2014-05-30 DIAGNOSIS — G8929 Other chronic pain: Secondary | ICD-10-CM

## 2014-05-30 DIAGNOSIS — K56609 Unspecified intestinal obstruction, unspecified as to partial versus complete obstruction: Secondary | ICD-10-CM

## 2014-05-30 DIAGNOSIS — F4310 Post-traumatic stress disorder, unspecified: Secondary | ICD-10-CM

## 2014-05-30 DIAGNOSIS — R109 Unspecified abdominal pain: Secondary | ICD-10-CM

## 2014-05-30 LAB — BASIC METABOLIC PANEL
ANION GAP: 14 (ref 5–15)
BUN: 4 mg/dL — ABNORMAL LOW (ref 6–23)
CALCIUM: 8.6 mg/dL (ref 8.4–10.5)
CO2: 23 mEq/L (ref 19–32)
Chloride: 103 mEq/L (ref 96–112)
Creatinine, Ser: 0.96 mg/dL (ref 0.50–1.35)
GFR calc Af Amer: >90 mL/min (ref >90)
GFR calc non Af Amer: 86 mL/min — ABNORMAL LOW (ref >90)
Glucose, Bld: 120 mg/dL — ABNORMAL HIGH (ref 70–99)
Potassium: 3.2 mEq/L — ABNORMAL LOW (ref 3.7–5.3)
SODIUM: 140 meq/L (ref 137–147)

## 2014-05-30 LAB — CBC
HCT: 37.1 % — ABNORMAL LOW (ref 39.0–52.0)
Hemoglobin: 12.4 g/dL — ABNORMAL LOW (ref 13.0–17.0)
MCH: 28.8 pg (ref 26.0–34.0)
MCHC: 33.4 g/dL (ref 30.0–36.0)
MCV: 86.1 fL (ref 78.0–100.0)
PLATELETS: 156 10*3/uL (ref 150–400)
RBC: 4.31 MIL/uL (ref 4.22–5.81)
RDW: 13.1 % (ref 11.5–15.5)
WBC: 6.2 10*3/uL (ref 4.0–10.5)

## 2014-05-30 MED ORDER — HYDROMORPHONE HCL PF 1 MG/ML IJ SOLN
0.5000 mg | Freq: Once | INTRAMUSCULAR | Status: AC
  Administered 2014-05-30: 0.5 mg via INTRAVENOUS
  Filled 2014-05-30: qty 1

## 2014-05-30 MED ORDER — BISACODYL 10 MG RE SUPP
10.0000 mg | Freq: Once | RECTAL | Status: AC
  Administered 2014-05-30: 10 mg via RECTAL
  Filled 2014-05-30: qty 1

## 2014-05-30 MED ORDER — BOOST / RESOURCE BREEZE PO LIQD
1.0000 | Freq: Three times a day (TID) | ORAL | Status: DC
  Administered 2014-05-30: 17:00:00 via ORAL
  Administered 2014-05-31 (×2): 1 via ORAL

## 2014-05-30 NOTE — Progress Notes (Signed)
I have seen and examined the patient and agree with the assessment and plans.  Anastasios Melander A. Emiel Kielty  MD, FACS  

## 2014-05-30 NOTE — Progress Notes (Signed)
Patient ID: Jonathon Turner, male   DOB: April 14, 1951, 63 y.o.   MRN: [id number]     [facility]      [address] [place]   [place], [place] [address]    Phone: [phone] FAX: [phone]     Subjective: Had a BM with enema, flatus.  No n/v.  out of NGT. Still has pain. On chronic narcotics  Objective:  Vital signs:  Filed Vitals:   05/29/14 0504 05/29/14 1414 05/29/14 2200 05/30/14 0530  BP: 149/78 138/83 165/83 150/74  Pulse: 76 76 81 77  Temp: 98.2 F (36.8 C) 98.5 F (36.9 C) 98.1 F (36.7 C) 98.3 F (36.8 C)  TempSrc:  Oral Oral Oral  Resp: Height:      Weight:      SpO2: 97% 98% 99% 98%    Last BM Date: 05/29/14  Intake/Output   Yesterday:  09/09 0701 - 09/10 0700 In: 2590 [I.V.:2590] Out: 2000 [Urine:1700; Emesis/NG output:300] This shift:    I/O last 3 completed shifts: In: 3580 [I.V.:3490; NG/GT:90] Out: 2650 [Urine:2100; Emesis/NG output:550]     Physical Exam:  General: Pt awake/alert/oriented x4 in no acute distress  Abdomen: Soft. Nondistended. +BS. Upon ausculation, deeply palpated with the stethoscope and no pain was elicited.  Pain with minimal palpation was elicited manually . No evidence of peritonitis. No incarcerated hernias.  Problem List:   Principal Problem:   Partial small bowel obstruction Active Problems:   Chronic abdominal pain   PTSD (post-traumatic stress disorder)   Insomnia   BPH (benign prostatic hyperplasia)   Protein-calorie malnutrition, severe    Results:   Labs: No results found for this or any previous visit (from the past 48 hour(s)).  Imaging / Studies: Dg Abd 2 Views  05/29/2014   CLINICAL DATA:  Small bowel obstruction.  Acute abdominal pain.  EXAM: ABDOMEN - 2 VIEW  COMPARISON:  05/28/2014  FINDINGS: NG tube coils in the fundus of the stomach. Decreasing gaseous distention of bowel. No current evidence for obstruction. Surgical changes in the right  lower quadrant. No free air organomegaly. High-density material fills the gallbladder, likely vicarious excretion of contrast from prior CT.  IMPRESSION: No current evidence of bowel obstruction. NG tube coils in the fundus of the stomach.   Electronically Signed   By: Charlett Nose M.D.   On: 05/29/2014 08:46   Dg Abd Portable 1v  05/28/2014   CLINICAL DATA:  Vomiting, NG tube placement  EXAM: PORTABLE ABDOMEN - 1 VIEW  COMPARISON:  05/27/2014  FINDINGS: There is a nasogastric tube with the tip 4 cm above the gastroesophageal junction. Recommend advancing the nasogastric tube approximately 15 cm.  There is gaseous distension of the stomach. There are dilated loops of small bowel in the left-sided abdomen. There is air seen within the colon. There are surgical colonic anastomotic sutures in the right lower quadrant. There is no evidence of pneumoperitoneum, portal venous gas or pneumatosis. There are no pathologic calcifications along the expected course of the ureters.The osseous structures are unremarkable.  IMPRESSION: There is a nasogastric tube with the tip 4 cm above the gastroesophageal junction. Recommend advancing the nasogastric tube approximately 15 cm.  Mild gaseous distension of the stomach and small bowel loops in the left side of the abdomen with air seen within the colon. This may reflect an ileus versus partial small bowel obstruction. Continued radiographic follow-up recommended.   Electronically Signed   By:  Elige Ko   On: 05/28/2014 08:41    Medications / Allergies:  Scheduled Meds: . bisacodyl  10 mg Rectal Once  . clonazePAM  1 mg Per NG tube QHS  . clonazePAM  2 mg Per NG tube Daily  . docusate  100 mg Oral BID  . doxazosin  8 mg Per NG tube QHS  . enoxaparin (LOVENOX) injection  40 mg Subcutaneous Q24H  . methadone  5 mg Per NG tube 3 times per day  . pantoprazole (PROTONIX) IV  40 mg Intravenous Q24H   Continuous Infusions: . dextrose 5 % and 0.9% NaCl 100 mL/hr at  05/30/14 0008   PRN Meds:.docusate, HYDROmorphone (DILAUDID) injection, ondansetron (ZOFRAN) IV  Antibiotics: Anti-infectives   None      Assessment/Plan  1. Chronic abdominal pain  2. Ileus vs PSBO  3. ? 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 PTSD  5. BPH   AXR without obstruction.  Less NGT output, BM after enema.  Will clamp his NGT and give sips of clears.  Give dulcolax suppository.  Pt. Needs to mobilize.  Non surgical abdomen.  Will follow along.    Ashok Norris, [facility] Surgery Pager [phone]) For consults and floor pages call [phone](7A-4:30P)  05/30/2014 8:23 AM

## 2014-05-30 NOTE — Progress Notes (Signed)
1610 Patient c/o severe abdominal pain rated the pain a 10. Diluadid  IV given. 0410 Patient still c/o severe abdominal pain and rated it a 9. 0415 Dr. Jaquita Rector paged.[phone] Dr. Jaquita Rector up to assess patient. 0455 Dilaudid 0.5mg  given IV per order. Patient rates the pain a 9. Schedule methadone  given at 0522. 0600 Patient is resting at present. Will continue to monitor.

## 2014-05-30 NOTE — Progress Notes (Signed)
FMTS Attending Daily Note:  Renold Don MD  [phone] pager  Family Practice pager:  [phone] I have discussed this patient with the resident Dr. Caroleen Hamman.  I agree with their findings, assessment, and care plan.  Overall patient slowly improving.  NG clamped.  Attempting clears.  Appreciate Surg input.  Wonder about psych component with pain.  Little tenderness even with deep pressure when auscultating, but begins writhing in pain with regular palpation.

## 2014-05-30 NOTE — Progress Notes (Signed)
Family Medicine Teaching Service Daily Progress Note Intern Pager: [phone]  Patient name: Jonathon Turner Medical record number: [id number] Date of birth: Aug 05, 1951 Age: 63 y.o. Gender: male  Primary Care Provider: Pcp Not In System Consultants: Surgery Code Status: FULL  Pt Overview and Major Events to Date:  9/7: Admitted to FPTS  Assessment and Plan: Jonathon Turner is a 62 y.o. male presenting with severe abdominal pain with abdominal films showing constipation. PMH is significant for PTSD, hx of bowel obstruction and multiple abdominal surgeries, chronic abdominal pain (on Methadone), insomnia, and BPH.   #Severe Abdominal Pain Secondary to Constipation:   - CT- distended/inflamed loops of small bowel and stool like appearance of small bowel contents consistent with small bowel obstruction/malabsorption; gas and stool throughout entire colon -Abdominal Xray- no current evidence of obstruction - General surgery consulted- recommend no acute surgical interventions at this time  -Dulcolax Suppository - IV fluids - D5 NS @ 100 mL/hr  - NPO, NG in place for gastric decompression   -Will clamp NG tube and transition to clear fluids today - Methadone  TID with Dilaudid  q4hr PRN for breakthrough pain  -Dilaudid at 0333, 0455 (given 0.5mg ), 0910  -Methadone at [employer] Enema given on 05/30/14 with bowel movement shortly thereafter -Colace  BID PRN -Zofran PRN nausea - Holding home gabapentin and Elavil to limit medications passing through the gut.   FEN/GI: Clear liquid, D5 NS @ 100 mL/hr  Prophylaxis: Lovenox  Disposition: Admitted to [facility] Medicine Teaching Service.  Subjective:  Complaint of pain overnight and was given dilaudid 0.5mg .  States pain this morning is improved, but is still 8/10 diffusely throughout the abdomen.  States his nausea and headache are improved.  Has had two bowel movements yesterday and states his pain is improved after bowel movements.   No further complaints today.  Objective: Temp:  [98.1 F (36.7 C)-98.5 F (36.9 C)] 98.3 F (36.8 C) (09/10 0530) Pulse Rate:  [76-81] 77 (09/10 0530) Resp:  [16-17] 17 (09/09 2200) BP: (138-165)/(74-83) 150/74 mmHg (09/10 0530) SpO2:  [98 %-99 %] 98 % (09/10 0530) Physical Exam: General: 62yo male resting comfortably in moderate distress secondary to nausea and pain Cardiovascular: S1 and S2 noted. Systolic murmur noted. Regular rate and rhythm Respiratory: Clear to auscultation bilaterally. No increased work of breathing. Abdomen: Bowel sounds noted.  Diffuse tenderness, worse in LUQ. No rebound tenderness and negative Rovsing's sign.  No splenomegaly noted. No pain with deep auscultation.  Extremities: No edema noted  Laboratory:  Recent Labs Lab 05/27/14 1030 05/28/14 0522  WBC 13.8* 7.9  HGB 14.2 12.5*  HCT 41.9 37.7*  PLT 183 157    Recent Labs Lab 05/27/14 1030 05/28/14 0522  NA 140 139  K 4.6 4.2  CL 102 102  CO2 25 26  BUN 12 11  CREATININE 1.19 1.11  CALCIUM 9.2 8.3*  PROT 6.9  --   BILITOT 0.3  --   ALKPHOS 66  --   ALT 8  --   AST 17  --   GLUCOSE 112* 140*   UA- clear Urine Culture- Pending  Imaging/Diagnostic Tests: 9/7:  CT Abdomen/Pelvis Markedly abnormal appearance of the small bowel loops which are distended and inflamed with small amount of free fluid in the pelvis and abdomen. Stool like appearance of small bowel contents consistent with small bowel obstruction/malabsorption.   9/9 Abdominal Xray No current evidence of obstruction  Araceli Bouche, DO 05/30/2014, 8:47 AM PGY-1, [facility] Intern pager: [phone], text pages welcome

## 2014-05-31 DIAGNOSIS — N400 Enlarged prostate without lower urinary tract symptoms: Secondary | ICD-10-CM

## 2014-05-31 DIAGNOSIS — E43 Unspecified severe protein-calorie malnutrition: Secondary | ICD-10-CM

## 2014-05-31 LAB — BASIC METABOLIC PANEL
ANION GAP: 11 (ref 5–15)
BUN: 4 mg/dL — AB (ref 6–23)
CHLORIDE: 106 meq/L (ref 96–112)
CO2: 26 mEq/L (ref 19–32)
Calcium: 8.5 mg/dL (ref 8.4–10.5)
Creatinine, Ser: 1.07 mg/dL (ref 0.50–1.35)
GFR calc Af Amer: 83 mL/min — ABNORMAL LOW (ref >90)
GFR, EST NON AFRICAN AMERICAN: 72 mL/min — AB (ref >90)
Glucose, Bld: 95 mg/dL (ref 70–99)
Potassium: 3.4 mEq/L — ABNORMAL LOW (ref 3.7–5.3)
SODIUM: 143 meq/L (ref 137–147)

## 2014-05-31 LAB — CBC
HEMATOCRIT: 35.9 % — AB (ref 39.0–52.0)
Hemoglobin: 11.8 g/dL — ABNORMAL LOW (ref 13.0–17.0)
MCH: 28.3 pg (ref 26.0–34.0)
MCHC: 32.9 g/dL (ref 30.0–36.0)
MCV: 86.1 fL (ref 78.0–100.0)
Platelets: 142 10*3/uL — ABNORMAL LOW (ref 150–400)
RBC: 4.17 MIL/uL — ABNORMAL LOW (ref 4.22–5.81)
RDW: 13.3 % (ref 11.5–15.5)
WBC: 5.6 10*3/uL (ref 4.0–10.5)

## 2014-05-31 MED ORDER — TAMSULOSIN HCL 0.4 MG PO CAPS: 0.4000 mg | ORAL_CAPSULE | Freq: Every day | ORAL | Status: DC

## 2014-05-31 MED ORDER — PANTOPRAZOLE SODIUM 40 MG PO TBEC: 40.0000 mg | DELAYED_RELEASE_TABLET | Freq: Every day | ORAL | Status: DC

## 2014-05-31 NOTE — Progress Notes (Signed)
I have seen and examined the patient and agree with the assessment and plans. Will sign off  Philopateer Strine A. Jury Caserta  MD, FACS 

## 2014-05-31 NOTE — Progress Notes (Signed)
Patient ID: Jonathon Turner, male   DOB: [birth year], 63 y.o.   MRN: [id number]     [facility]      [address] [place], [place] [address]    Phone: [phone] FAX: [phone]     Subjective: Having BMs.  No n/v.  Tolerating fulls.   Objective:  Vital signs:  Filed Vitals:   05/30/14 1026 05/30/14 1430 05/30/14 2153 05/31/14 0520  BP: 142/69 136/69 171/81 132/72  Pulse: 78 87 90 86  Temp: 98.3 F (36.8 C) 97.2 F (36.2 C) 98.2 F (36.8 C) 98.2 F (36.8 C)  TempSrc: Oral Oral Oral Oral  Resp: 16 18 17 18   Height:      Weight:      SpO2: 95% 98% 100% 100%    Last BM Date: 05/30/14  Intake/Output   Yesterday:  09/10 0701 - 09/11 0700 In: 1960 [P.O.:360; I.V.:1600] Out: 1841 [Urine:1725; Emesis/NG output:115; Stool:1] This shift:  Total I/O In: -  Out: 125 [Urine:125]  Physical Exam:  General: Pt awake/alert/oriented x4 in no acute distress  Abdomen: Soft. Nondistended. +BS. Non tender on exam today. No evidence of peritonitis. No incarcerated hernias.   Problem List:   Principal Problem:   Partial small bowel obstruction Active Problems:   Chronic abdominal pain   PTSD (post-traumatic stress disorder)   Insomnia   BPH (benign prostatic hyperplasia)   Protein-calorie malnutrition, severe    Results:   Labs: Results for orders placed during the hospital encounter of 05/27/14 (from the past 48 hour(s))  CBC     Status: Abnormal   Collection Time    05/30/14 10:44 AM      Result Value Ref Range   WBC 6.2  4.0 - 10.5 K/uL   RBC 4.31  4.22 - 5.81 MIL/uL   Hemoglobin 12.4 (*) 13.0 - 17.0 g/dL   HCT 37.1 (*) 39.0 - 52.0 %   MCV 86.1  78.0 - 100.0 fL   MCH 28.8  26.0 - 34.0 pg   MCHC 33.4  30.0 - 36.0 g/dL   RDW 13.1  11.5 - 15.5 %   Platelets 156  150 - 400 K/uL  BASIC METABOLIC PANEL     Status: Abnormal   Collection Time    05/30/14 10:44 AM      Result Value Ref Range   Sodium 140  137 - 147  mEq/L   Potassium 3.2 (*) 3.7 - 5.3 mEq/L   Chloride 103  96 - 112 mEq/L   CO2 23  19 - 32 mEq/L   Glucose, Bld 120 (*) 70 - 99 mg/dL   BUN 4 (*) 6 - 23 mg/dL   Creatinine, Ser 0.96  0.50 - 1.35 mg/dL   Calcium 8.6  8.4 - 10.5 mg/dL   GFR calc non Af Amer 86 (*) >90 mL/min   GFR calc Af Amer >90  >90 mL/min   Comment: (NOTE)     The eGFR has been calculated using the CKD EPI equation.     This calculation has not been validated in all clinical situations.     eGFR's persistently <90 mL/min signify possible Chronic Kidney     Disease.   Anion gap 14  5 - 15    Imaging / Studies: No results found.  Medications / Allergies:  Scheduled Meds: . clonazePAM  1 mg Per NG tube QHS  . clonazePAM  2 mg Per NG tube Daily  . docusate  100 mg Oral BID  . doxazosin  8 mg Per NG tube QHS  . enoxaparin (LOVENOX) injection  40 mg Subcutaneous Q24H  . feeding supplement (RESOURCE BREEZE)  1 Container Oral TID WC  . methadone  5 mg Per NG tube 3 times per day  . pantoprazole (PROTONIX) IV  40 mg Intravenous Q24H   Continuous Infusions: . dextrose 5 % and 0.9% NaCl 100 mL/hr at 05/31/14 0551   PRN Meds:.docusate, HYDROmorphone (DILAUDID) injection, ondansetron (ZOFRAN) IV  Antibiotics: Anti-infectives   None       Assessment/Plan  1. Chronic abdominal pain  2. Ileus vs PSBO  3. ? 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 PTSD  5. BPH   Having BMs, tolerating full liquids.  Continues to c/o pain.  Physical exam is benign.  No role for surgery.  Will advance to soft diet. If able to tolerate, DC from surgical standpoint with f/u at [facility].  Surgery will sign off.  Please call with questions or concerns.    Erby Pian, [facility] Surgery Pager [phone]) For consults and floor pages call [phone](7A-4:30P)   05/31/2014 8:49 AM

## 2014-05-31 NOTE — Discharge Summary (Signed)
Family Medicine Teaching [facility] Discharge Summary  Patient name: Jonathon Turner Medical record number: [id number] Date of birth: 01/19/1951 Age: 63 y.o. Gender: male Date of Admission: 05/27/2014  Date of Discharge: 05/31/14 Admitting Physician: Uvaldo Rising, MD  Primary Care Provider: Pcp Not In System Consultants: General Surgery  Indication for Hospitalization: Abdominal Pain concerning for Bowel Obstruction  Discharge Diagnoses/Problem List:  Partial Bowel Obstruction Constipation PTSD BPH  Disposition: Discharge Home  Discharge Condition: Stable  Brief Hospital Course:  Jonathon Turner is a 63yo male with history of bowel obstruction with numerous abdominal surgeries, chronic abdominal pain, insomnia, PTSD, and BPH who presented to the ED with worsening abdominal pain, nausea, and vomiting for one week and was admitted to the [facility] Medicine Teaching Service on 05/27/14 for concerns of possible bowel obstruction. Initial WBC was 13.8. UA was clear. CT scan upon presentation to the emergency department showed distended and inflamed loops of small bowel and stool like appearance of small bowel contents consistent with small bowel obstruction; gas and stool were present throughout the entire colon.  General surgery was consulted and determined his abdominal pain was most likely dysmotility secondary to pain medication; abdominal surgery was not recommended. Jonathon Turner was placed on bowel rest with NG tube placement and was treated for his abdominal pain (dilaudid, morphine) and nausea (zofran). Severe constipation was treated with soap water enema, Colace, and dulcolax suppository with significant relief of symptoms. Abdominal Xray on 9/9 showed no evidence of obstruction. WBC trended down to 5.6 prior to discharge.  He was discharged on  05/31/14 with 0/10 pain and no complaint of nausea or vomiting.  Issues for Follow Up:  -Transitioned from Doxazosin to tamsulosin 0.4mg . Dose may be  increased after 2 to 4 weeks to 0.8 mg once daily if no response is noted. -Continue to monitor constipation.  Discharged on Colace  BID PRN.  Significant Procedures: None  Significant Labs and Imaging:   Recent Labs Lab 05/28/14 0522 05/30/14 1044 05/31/14 1002  WBC 7.9 6.2 5.6  HGB 12.5* 12.4* 11.8*  HCT 37.7* 37.1* 35.9*  PLT 157 156 142*    Recent Labs Lab 05/27/14 1030 05/28/14 0522 05/30/14 1044 05/31/14 1002  NA 140 139 140 143  K 4.6 4.2 3.2* 3.4*  CL 102 102 103 106  CO2 GLUCOSE 112* 140* 120* 95  BUN 12 11 4* 4*  CREATININE 1.19 1.11 0.96 1.07  CALCIUM 9.2 8.3* 8.6 8.5  ALKPHOS 66  --   --   --   AST 17  --   --   --   ALT 8  --   --   --   ALBUMIN 3.4*  --   --   --    Urine Culture:  No Growth  CT Abdomen on 9/7: Small bowel loops which are distended and inflamed with small amount of free fluid in the pelvis and abdomen. May be related to adhesions. Stool like appearance of small bowel contents consistent with small bowel obstruction/malabsorption  Portable 1 View of Abdomen on 9/8: Mild gaseous distension of the stomach and small bowel loops in the left side of the abdomen with air seen within the colon. May reflect an ileus vs partial small bowel obstruction.   2 View of Abdomen on 9/9: No current evidence of bowel obstruction. NG tube coils in the fundus of the stomach.  Results/Tests Pending at Time of Discharge: None  Discharge Medications:  Medication List    STOP taking these medications       doxazosin 8 MG tablet  Commonly known as:  CARDURA      TAKE these medications       amitriptyline 50 MG tablet  Commonly known as:  ELAVIL  Take 100 mg by mouth at bedtime.     aspirin EC 81 MG tablet  Take 81 mg by mouth daily.     cholecalciferol 1000 UNITS tablet  Commonly known as:  VITAMIN D  Take 2,000 Units by mouth daily.     clonazePAM 1 MG tablet  Commonly known as:  KLONOPIN  Take 1-2 mg by mouth 2 (two)  times daily. Take 2 tablet in the morning and 1 tablet at night     docusate sodium 100 MG capsule  Commonly known as:  COLACE  Take 200 mg by mouth 2 (two) times daily as needed for mild constipation.     gabapentin 300 MG capsule  Commonly known as:  NEURONTIN  Take 600 mg by mouth at bedtime.     METHADONE HCL PO  Take 1 tablet by mouth 3 (three) times daily before meals.     omeprazole 20 MG capsule  Commonly known as:  PRILOSEC  Take 20 mg by mouth daily.     tamsulosin 0.4 MG Caps capsule  Commonly known as:  FLOMAX  Take 1 capsule (0.4 mg total) by mouth daily.     vitamin B-12 1000 MCG tablet  Commonly known as:  CYANOCOBALAMIN  Take 1,000 mcg by mouth daily.     zolpidem 10 MG tablet  Commonly known as:  AMBIEN  Take 10 mg by mouth at bedtime.        Discharge Instructions: Please refer to Patient Instructions section of EMR for full details.  Patient was counseled important signs and symptoms that should prompt return to medical care, changes in medications, dietary instructions, activity restrictions, and follow up appointments.   Follow-Up Appointments:     Follow-up Information   Schedule an appointment as soon as possible for a visit with Family Doctor.      [address] [place], [place] 05/31/2014, 1:58 PM PGY-1, [facility] Family Medicine

## 2014-05-31 NOTE — Progress Notes (Signed)
Family Medicine Teaching Service Daily Progress Note Intern Pager: [phone]  Patient name: Jonathon Turner Medical record number: [id number] Date of birth: 01-Aug-1951 Age: 63 y.o. Gender: male  Primary Care Provider: Pcp Not In System Consultants: Surgery Code Status: FULL  Assessment and Plan: [name] is a 63 y.o. male presenting with severe abdominal pain with abdominal films showing constipation. PMH is significant for PTSD, hx of bowel obstruction and multiple abdominal surgeries, chronic abdominal pain (on Methadone), insomnia, and BPH.   #Severe Abdominal Pain Secondary to Constipation:   - IV fluids - D5 NS @ 100 mL/hr  - Advance Diet as Tolerated- currently on soft diet  -Removal of NG tube today - Methadone  TID with Dilaudid  q4hr PRN for breakthrough pain  -No dilaudid required since 0910 yesterday  -Methadone at 0519 -Colace PRN constipation -Soap suds enema today -CT- distended/inflamed loops of small bowel and stool like appearance of small bowel contents consistent with small bowel obstruction/malabsorption; gas and stool throughout entire colon -Abdominal Xray- no current evidence of obstruction - General surgery consulted- recommend no acute surgical interventions at this time  FEN/GI: Advancing diet as tolerated, D5 NS @ 100 mL/hr  Prophylaxis: Lovenox  Disposition: Admitted to [facility] Medicine Teaching Service. Anticipated discharge today.  Subjective:  No acute complaints overnight.  Denies pain 0/10.  Last bowel movement yesterday and would like an enema today, but otherwise feels ready to go home.  Denies nausea.  No further complaints today.  Objective: Temp:  [97.2 F (36.2 C)-98.3 F (36.8 C)] 98.2 F (36.8 C) (09/11 0520) Pulse Rate:  [78-90] 86 (09/11 0520) Resp:  [16-18] 18 (09/11 0520) BP: (132-171)/(69-81) 132/72 mmHg (09/11 0520) SpO2:  [95 %-100 %] 100 % (09/11 0520) Physical Exam: General: 62yo male resting comfortably and in no  apparent distress Cardiovascular: S1 and S2 noted. Systolic murmur noted. Regular rate and rhythm Respiratory: Clear to auscultation bilaterally. No increased work of breathing. Abdomen: Bowel sounds noted.  No tenderness on palpation. Soft and nondistended Extremities: No edema noted  Laboratory:  Recent Labs Lab 05/27/14 1030 05/28/14 0522 05/30/14 1044  WBC 13.8* 7.9 6.2  HGB 14.2 12.5* 12.4*  HCT 41.9 37.7* 37.1*  PLT 183 157 156    Recent Labs Lab 05/27/14 1030 05/28/14 0522 05/30/14 1044  NA 140 139 140  K 4.6 4.2 3.2*  CL 102 102 103  CO2 BUN 12 11 4*  CREATININE 1.19 1.11 0.96  CALCIUM 9.2 8.3* 8.6  PROT 6.9  --   --   BILITOT 0.3  --   --   ALKPHOS 66  --   --   ALT 8  --   --   AST 17  --   --   GLUCOSE 112* 140* 120*   UA- clear Urine Culture- Pending  Imaging/Diagnostic Tests: 9/7:  CT Abdomen/Pelvis Markedly abnormal appearance of the small bowel loops which are distended and inflamed with small amount of free fluid in the pelvis and abdomen. Stool like appearance of small bowel contents consistent with small bowel obstruction/malabsorption.   9/9 Abdominal Xray No current evidence of obstruction  Araceli Bouche, DO 05/31/2014, 8:29 AM PGY-1, [facility] Family Medicine FPTS Intern pager: [phone], text pages welcome

## 2014-05-31 NOTE — Progress Notes (Signed)
FMTS Attending Daily Note:  Renold Don MD  [phone] pager  Family Practice pager:  [phone] I have seen and examined this patient and have reviewed their chart. I have discussed this patient with the resident. I agree with the resident's findings, assessment and care plan.  Additionally:  Much improved.  Plan for DC home today with better stool regimen at home.   Tobey Grim, MD 05/31/2014 1:39 PM

## 2014-06-04 NOTE — Discharge Summary (Signed)
Family Medicine Teaching Service  Discharge Note : Attending Jeff Sonjia Wilcoxson MD Pager [phone] Inpatient Team Pager:  [phone]  I have reviewed this patient and the patient's chart and have discussed discharge planning with the resident at the time of discharge. I agree with the discharge plan as above.    

## 2016-02-29 ENCOUNTER — Encounter (HOSPITAL_COMMUNITY): Payer: Self-pay | Admitting: *Deleted

## 2016-02-29 ENCOUNTER — Emergency Department (HOSPITAL_COMMUNITY): Payer: Non-veteran care

## 2016-02-29 ENCOUNTER — Observation Stay (HOSPITAL_COMMUNITY)
Admission: EM | Admit: 2016-02-29 | Discharge: 2016-03-01 | Disposition: A | Discharge status: Home / Self Care | Payer: Non-veteran care | Attending: Internal Medicine | Admitting: Internal Medicine

## 2016-02-29 DIAGNOSIS — R112 Nausea with vomiting, unspecified: Secondary | ICD-10-CM | POA: Diagnosis present

## 2016-02-29 DIAGNOSIS — Z79899 Other long term (current) drug therapy: Secondary | ICD-10-CM | POA: Diagnosis not present

## 2016-02-29 DIAGNOSIS — R509 Fever, unspecified: Secondary | ICD-10-CM

## 2016-02-29 DIAGNOSIS — Z7982 Long term (current) use of aspirin: Secondary | ICD-10-CM | POA: Insufficient documentation

## 2016-02-29 DIAGNOSIS — R1031 Right lower quadrant pain: Principal | ICD-10-CM | POA: Insufficient documentation

## 2016-02-29 DIAGNOSIS — R109 Unspecified abdominal pain: Secondary | ICD-10-CM

## 2016-02-29 LAB — COMPREHENSIVE METABOLIC PANEL
ALT: 9 U/L — AB (ref 17–63)
AST: 17 U/L (ref 15–41)
Albumin: 3.6 g/dL (ref 3.5–5.0)
Alkaline Phosphatase: 55 U/L (ref 38–126)
Anion gap: 11 (ref 5–15)
BUN: 12 mg/dL (ref 6–20)
CO2: 24 mmol/L (ref 22–32)
CREATININE: 1.09 mg/dL (ref 0.61–1.24)
Calcium: 9.3 mg/dL (ref 8.9–10.3)
Chloride: 104 mmol/L (ref 101–111)
GFR calc Af Amer: >60 mL/min (ref >60)
GFR calc non Af Amer: >60 mL/min (ref >60)
Glucose, Bld: 143 mg/dL — ABNORMAL HIGH (ref 65–99)
Potassium: 3.6 mmol/L (ref 3.5–5.1)
SODIUM: 139 mmol/L (ref 135–145)
Total Bilirubin: 0.7 mg/dL (ref 0.3–1.2)
Total Protein: 6.9 g/dL (ref 6.5–8.1)

## 2016-02-29 LAB — LIPASE, BLOOD: Lipase: 17 U/L (ref 11–51)

## 2016-02-29 LAB — CBC WITH DIFFERENTIAL/PLATELET
BASOS ABS: 0 10*3/uL (ref 0.0–0.1)
Basophils Relative: 0 %
EOS ABS: 0.1 10*3/uL (ref 0.0–0.7)
EOS PCT: 0 %
HCT: 40.9 % (ref 39.0–52.0)
Hemoglobin: 13.6 g/dL (ref 13.0–17.0)
Lymphocytes Relative: 16 %
Lymphs Abs: 2.1 10*3/uL (ref 0.7–4.0)
MCH: 29.1 pg (ref 26.0–34.0)
MCHC: 33.3 g/dL (ref 30.0–36.0)
MCV: 87.6 fL (ref 78.0–100.0)
MONO ABS: 0.4 10*3/uL (ref 0.1–1.0)
Monocytes Relative: 3 %
Neutro Abs: 11.2 10*3/uL — ABNORMAL HIGH (ref 1.7–7.7)
Neutrophils Relative %: 81 %
PLATELETS: 241 10*3/uL (ref 150–400)
RBC: 4.67 MIL/uL (ref 4.22–5.81)
RDW: 13.7 % (ref 11.5–15.5)
WBC: 13.8 10*3/uL — ABNORMAL HIGH (ref 4.0–10.5)

## 2016-02-29 MED ORDER — DIATRIZOATE MEGLUMINE & SODIUM 66-10 % PO SOLN
ORAL | Status: AC
  Administered 2016-02-29: 22:00:00
  Filled 2016-02-29: qty 30

## 2016-02-29 MED ORDER — IOPAMIDOL (ISOVUE-300) INJECTION 61%
100.0000 mL | Freq: Once | INTRAVENOUS | Status: AC | PRN
  Administered 2016-02-29: 100 mL via INTRAVENOUS

## 2016-02-29 MED ORDER — ONDANSETRON HCL 4 MG/2ML IJ SOLN
4.0000 mg | Freq: Once | INTRAMUSCULAR | Status: AC
  Administered 2016-02-29: 4 mg via INTRAVENOUS
  Filled 2016-02-29: qty 2

## 2016-02-29 MED ORDER — HYDROMORPHONE HCL 1 MG/ML IJ SOLN
1.0000 mg | Freq: Once | INTRAMUSCULAR | Status: AC
  Administered 2016-02-29: 1 mg via INTRAVENOUS
  Filled 2016-02-29: qty 1

## 2016-02-29 MED ORDER — SODIUM CHLORIDE 0.9 % IV BOLUS (SEPSIS)
1000.0000 mL | Freq: Once | INTRAVENOUS | Status: AC
  Administered 2016-02-29: 1000 mL via INTRAVENOUS

## 2016-02-29 MED ORDER — LORAZEPAM 2 MG/ML IJ SOLN
0.5000 mg | Freq: Once | INTRAMUSCULAR | Status: AC
  Administered 2016-02-29: 0.5 mg via INTRAVENOUS
  Filled 2016-02-29: qty 1

## 2016-02-29 NOTE — ED Notes (Signed)
Pt c/o sharp lower abdominal pain, nausea and vomiting that started yesterday. 6 episodes of vomiting since this morning. Pt is diaphoretic. Pt has hx of bowel obstructions. Pt is prescribed Methadone TID and he has taken three doses today with no relief.
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
## 2016-02-29 NOTE — ED Notes (Signed)
Patient unable to use restroom. He will let someone know when he can use the restroom.

## 2016-02-29 NOTE — ED Notes (Signed)
Patient transported to CT 

## 2016-03-01 ENCOUNTER — Observation Stay (HOSPITAL_COMMUNITY): Payer: Non-veteran care

## 2016-03-01 ENCOUNTER — Encounter (HOSPITAL_COMMUNITY): Payer: Self-pay | Admitting: Internal Medicine

## 2016-03-01 DIAGNOSIS — R509 Fever, unspecified: Secondary | ICD-10-CM | POA: Insufficient documentation

## 2016-03-01 DIAGNOSIS — R1013 Epigastric pain: Secondary | ICD-10-CM

## 2016-03-01 DIAGNOSIS — R112 Nausea with vomiting, unspecified: Secondary | ICD-10-CM | POA: Diagnosis present

## 2016-03-01 DIAGNOSIS — R109 Unspecified abdominal pain: Secondary | ICD-10-CM | POA: Diagnosis not present

## 2016-03-01 DIAGNOSIS — R502 Drug induced fever: Secondary | ICD-10-CM

## 2016-03-01 LAB — URINALYSIS, ROUTINE W REFLEX MICROSCOPIC
Bilirubin Urine: NEGATIVE
Glucose, UA: NEGATIVE mg/dL
Ketones, ur: 15 mg/dL — AB
Leukocytes, UA: NEGATIVE
NITRITE: NEGATIVE
Protein, ur: NEGATIVE mg/dL
pH: 6.5 (ref 5.0–8.0)

## 2016-03-01 LAB — CBC WITH DIFFERENTIAL/PLATELET
BASOS ABS: 0 10*3/uL (ref 0.0–0.1)
Basophils Relative: 0 %
Eosinophils Absolute: 0 10*3/uL (ref 0.0–0.7)
Eosinophils Relative: 0 %
HEMATOCRIT: 38.4 % — AB (ref 39.0–52.0)
HEMOGLOBIN: 12.5 g/dL — AB (ref 13.0–17.0)
LYMPHS PCT: 15 %
Lymphs Abs: 1.6 10*3/uL (ref 0.7–4.0)
MCH: 28.8 pg (ref 26.0–34.0)
MCHC: 32.6 g/dL (ref 30.0–36.0)
MCV: 88.5 fL (ref 78.0–100.0)
MONO ABS: 0.6 10*3/uL (ref 0.1–1.0)
Monocytes Relative: 5 %
NEUTROS ABS: 8.4 10*3/uL — AB (ref 1.7–7.7)
NEUTROS PCT: 80 %
Platelets: 192 10*3/uL (ref 150–400)
RBC: 4.34 MIL/uL (ref 4.22–5.81)
RDW: 13.5 % (ref 11.5–15.5)
WBC: 10.5 10*3/uL (ref 4.0–10.5)

## 2016-03-01 LAB — URINE MICROSCOPIC-ADD ON

## 2016-03-01 LAB — COMPREHENSIVE METABOLIC PANEL
ALBUMIN: 3.2 g/dL — AB (ref 3.5–5.0)
ALT: 9 U/L — ABNORMAL LOW (ref 17–63)
ANION GAP: 6 (ref 5–15)
AST: 16 U/L (ref 15–41)
Alkaline Phosphatase: 48 U/L (ref 38–126)
BILIRUBIN TOTAL: 0.5 mg/dL (ref 0.3–1.2)
BUN: 10 mg/dL (ref 6–20)
CO2: 28 mmol/L (ref 22–32)
Calcium: 8.6 mg/dL — ABNORMAL LOW (ref 8.9–10.3)
Chloride: 103 mmol/L (ref 101–111)
Creatinine, Ser: 0.93 mg/dL (ref 0.61–1.24)
GFR calc Af Amer: >60 mL/min (ref >60)
GFR calc non Af Amer: >60 mL/min (ref >60)
GLUCOSE: 156 mg/dL — AB (ref 65–99)
POTASSIUM: 4.1 mmol/L (ref 3.5–5.1)
SODIUM: 137 mmol/L (ref 135–145)
TOTAL PROTEIN: 6.1 g/dL — AB (ref 6.5–8.1)

## 2016-03-01 LAB — GLUCOSE, CAPILLARY: GLUCOSE-CAPILLARY: 170 mg/dL — AB (ref 65–99)

## 2016-03-01 MED ORDER — VITAMIN B-12 1000 MCG PO TABS
1000.0000 ug | ORAL_TABLET | Freq: Every day | ORAL | Status: DC
  Administered 2016-03-01: 1000 ug via ORAL
  Filled 2016-03-01: qty 1

## 2016-03-01 MED ORDER — ONDANSETRON HCL 4 MG PO TABS: 4.0000 mg | ORAL_TABLET | Freq: Four times a day (QID) | ORAL | Status: DC | PRN

## 2016-03-01 MED ORDER — AMITRIPTYLINE HCL 25 MG PO TABS
100.0000 mg | ORAL_TABLET | Freq: Every day | ORAL | Status: DC
  Administered 2016-03-01: 100 mg via ORAL
  Filled 2016-03-01: qty 4

## 2016-03-01 MED ORDER — TAMSULOSIN HCL 0.4 MG PO CAPS: 0.4000 mg | ORAL_CAPSULE | Freq: Every evening | ORAL | Status: DC

## 2016-03-01 MED ORDER — GABAPENTIN 300 MG PO CAPS
600.0000 mg | ORAL_CAPSULE | Freq: Every day | ORAL | Status: DC
  Administered 2016-03-01: 600 mg via ORAL
  Filled 2016-03-01: qty 2

## 2016-03-01 MED ORDER — ONDANSETRON HCL 4 MG/2ML IJ SOLN
4.0000 mg | Freq: Four times a day (QID) | INTRAMUSCULAR | Status: DC | PRN
  Administered 2016-03-01: 4 mg via INTRAVENOUS
  Filled 2016-03-01: qty 2

## 2016-03-01 MED ORDER — VITAMIN D 1000 UNITS PO TABS
2000.0000 [IU] | ORAL_TABLET | Freq: Every day | ORAL | Status: DC
  Administered 2016-03-01: 2000 [IU] via ORAL
  Filled 2016-03-01: qty 2

## 2016-03-01 MED ORDER — TRAZODONE HCL 50 MG PO TABS
25.0000 mg | ORAL_TABLET | Freq: Every day | ORAL | Status: DC
  Administered 2016-03-01: 25 mg via ORAL
  Filled 2016-03-01: qty 1

## 2016-03-01 MED ORDER — ACETAMINOPHEN 650 MG RE SUPP: 650.0000 mg | Freq: Four times a day (QID) | RECTAL | Status: DC | PRN

## 2016-03-01 MED ORDER — CLONAZEPAM 0.5 MG PO TABS
0.2500 mg | ORAL_TABLET | Freq: Every morning | ORAL | Status: DC
  Administered 2016-03-01: 0.25 mg via ORAL
  Filled 2016-03-01: qty 1

## 2016-03-01 MED ORDER — HYDROMORPHONE HCL 1 MG/ML IJ SOLN
0.5000 mg | INTRAMUSCULAR | Status: DC | PRN
  Administered 2016-03-01: 0.5 mg via INTRAVENOUS
  Filled 2016-03-01: qty 1

## 2016-03-01 MED ORDER — PANTOPRAZOLE SODIUM 40 MG PO TBEC
40.0000 mg | DELAYED_RELEASE_TABLET | Freq: Every day | ORAL | Status: DC
  Administered 2016-03-01: 40 mg via ORAL
  Filled 2016-03-01: qty 1

## 2016-03-01 MED ORDER — METHADONE HCL 10 MG PO TABS
5.0000 mg | ORAL_TABLET | Freq: Three times a day (TID) | ORAL | Status: DC
Start: 2016-03-01 — End: 2016-03-01
  Administered 2016-03-01 (×2): 5 mg via ORAL
  Filled 2016-03-01 (×2): qty 1

## 2016-03-01 MED ORDER — METOCLOPRAMIDE HCL 5 MG PO TABS: 5.0000 mg | ORAL_TABLET | Freq: Three times a day (TID) | ORAL | Status: DC

## 2016-03-01 MED ORDER — ACETAMINOPHEN 325 MG PO TABS: 650.0000 mg | ORAL_TABLET | Freq: Four times a day (QID) | ORAL | Status: DC | PRN

## 2016-03-01 MED ORDER — ASPIRIN EC 81 MG PO TBEC: 81.0000 mg | DELAYED_RELEASE_TABLET | Freq: Every evening | ORAL | Status: DC

## 2016-03-01 MED ORDER — ENOXAPARIN SODIUM 40 MG/0.4ML ~~LOC~~ SOLN
40.0000 mg | SUBCUTANEOUS | Status: DC
  Administered 2016-03-01: 40 mg via SUBCUTANEOUS
  Filled 2016-03-01: qty 0.4

## 2016-03-01 MED ORDER — DEXTROSE-NACL 5-0.9 % IV SOLN
INTRAVENOUS | Status: DC
  Administered 2016-03-01: 02:00:00 via INTRAVENOUS

## 2016-03-01 NOTE — Progress Notes (Signed)
Patient discharged with instructions, prescription, and care notes.  Verbalized understanding via teach back.  IV was removed and the site was WNL. Patient voiced no further complaints or concerns at the time of discharge.  Appointments scheduled per instructions.  Patient left the floor via w/c with staff and family in stable condition. 

## 2016-03-01 NOTE — Care Management Note (Signed)
Case Management Note  Patient Details  Name: Jonathon Turner MRN: [id number] Date of Birth: [birth year]  Subjective/Objective:                  Pt admitted as observation for abdominal pain. Pt's wife at bedside. Pt lives with wife and is ind with ADL's. [facility] Pt discharged and [facility].   Action/Plan: [facility]. No needs at DC.   Expected Discharge Date:  03/01/16               Expected Discharge Plan:  Home/Self Care  In-House Referral:  NA  Discharge planning Services  CM Consult  Post Acute Care Choice:  NA Choice offered to:  NA  DME Arranged:    DME Agency:     HH Arranged:    HH Agency:     Status of Service:  Completed, signed off  Medicare Important Message Given:    Date Medicare IM Given:    Medicare IM give by:    Date Additional Medicare IM Given:    Additional Medicare Important Message give by:     If discussed at Long Length of Stay Meetings, dates discussed:    Additional Comments:  Malcolm MetroChildress, Sandi Towe Demske, RN 03/01/2016, 11:35 AM

## 2016-03-01 NOTE — Discharge Summary (Signed)
Physician Discharge Summary  Jonathon Turner MRN: [id number] DOB/AGE: 1950-10-22 65 y.o.  PCP: Pcp Not In System   Admit date: 02/29/2016 Discharge date: 03/01/2016  Discharge Diagnoses:     Principal Problem:   Abdominal pain Active Problems:   Nausea & vomiting   Abdominal pain in male   Fever Viral gastroenteritis   Follow-up recommendations Follow-up with PCP in 3-5 days , including all  additional recommended appointments as below Follow-up CBC, CMP in 3-5 days Patient will benefit from establishing with gastroenterology for chronic ongoing weight loss, early satiety, may need endoscopic evaluation with the EGD, colonoscopy,      Current Discharge Medication List    START taking these medications   Details  metoCLOPramide (REGLAN) 5 MG tablet Take 1 tablet (5 mg total) by mouth 3 (three) times daily before meals. Qty: 90 tablet, Refills: 1      CONTINUE these medications which have NOT CHANGED   Details  amitriptyline (ELAVIL) 50 MG tablet Take 100 mg by mouth at bedtime.    aspirin EC 81 MG tablet Take 81 mg by mouth every evening.     cholecalciferol (VITAMIN D) 1000 UNITS tablet Take 2,000 Units by mouth at bedtime.     clonazePAM (KLONOPIN) 1 MG tablet Take 0.25 mg by mouth every morning.     docusate sodium (COLACE) 100 MG capsule Take 200 mg by mouth 2 (two) times daily.     gabapentin (NEURONTIN) 300 MG capsule Take 600 mg by mouth at bedtime.    methadone (DOLOPHINE) 5 MG tablet Take 5 mg by mouth every 8 (eight) hours.    omeprazole (PRILOSEC) 20 MG capsule Take 20 mg by mouth at bedtime.     tamsulosin (FLOMAX) 0.4 MG CAPS capsule Take 1 capsule (0.4 mg total) by mouth daily. Qty: 30 capsule, Refills: 0    traZODone (DESYREL) 25 mg TABS tablet Take 25 mg by mouth at bedtime.    !! UNKNOWN TO PATIENT Take 3 tablets by mouth at bedtime. Inflammation of stomach    !! UNKNOWN TO PATIENT Take 1 tablet by mouth at bedtime. For sleep    vitamin  B-12 (CYANOCOBALAMIN) 1000 MCG tablet Take 1,000 mcg by mouth daily.     !! - Potential duplicate medications found. Please discuss with provider.       Discharge Condition: Stable   Discharge Instructions Get Medicines reviewed and adjusted: Please take all your medications with you for your next visit with your Primary MD  Please request your Primary MD to go over all hospital tests and procedure/radiological results at the follow up, please ask your Primary MD to get all Hospital records sent to his/her office.  If you experience worsening of your admission symptoms, develop shortness of breath, life threatening emergency, suicidal or homicidal thoughts you must seek medical attention immediately by calling 911 or calling your MD immediately if symptoms less severe.  You must read complete instructions/literature along with all the possible adverse reactions/side effects for all the Medicines you take and that have been prescribed to you. Take any new Medicines after you have completely understood and accpet all the possible adverse reactions/side effects.   Do not drive when taking Pain medications.   Do not take more than prescribed Pain, Sleep and Anxiety Medications  Special Instructions: If you have smoked or chewed Tobacco in the last 2 yrs please stop smoking, stop any regular Alcohol and or any Recreational drug use.  Wear Seat belts while driving.  Please note  You were cared for by a hospitalist during your hospital stay. Once you are discharged, your primary care physician will handle any further medical issues. Please note that NO REFILLS for any discharge medications will be authorized once you are discharged, as it is imperative that you return to your primary care physician (or establish a relationship with a primary care physician if you do not have one) for your aftercare needs so that they can reassess your need for medications and monitor your lab  values.  Discharge Instructions    Diet - low sodium heart healthy    Complete by:  As directed      Increase activity slowly    Complete by:  As directed             Allergies  Allergen Reactions  . Haldol [Haloperidol] Swelling    Throat swelling   . Morphine And Related     Hallucinations   . Neosporin [Neomycin-Bacitracin Zn-Polymyx] Other (See Comments)    Makes Hole in skin at application site      Disposition: 01-Home or Self Care   Consults: None     Significant Diagnostic Studies:  Ct Abdomen Pelvis W Contrast  02/29/2016  CLINICAL DATA:  Sharp lower abdominal pain with nausea and vomiting beginning yesterday. Diaphoresis. Previous history of bowel obstructions. EXAM: CT ABDOMEN AND PELVIS WITH CONTRAST TECHNIQUE: Multidetector CT imaging of the abdomen and pelvis was performed using the standard protocol following bolus administration of intravenous contrast. CONTRAST:  113m ISOVUE-300 IOPAMIDOL (ISOVUE-300) INJECTION 61% COMPARISON:  05/27/2014 FINDINGS: Slight fibrosis in the lung bases. Mild diffuse fatty infiltration of the liver. Tiny sub cm low-attenuation lesions in the liver are likely represent small cysts or hemangiomas but are too small to characterize. No change since prior study. The gallbladder, spleen, pancreas, adrenal glands, inferior vena cava, and retroperitoneal lymph nodes are unremarkable. Calcification of the abdominal aorta. Cyst in the lower pole of the right kidney. No hydronephrosis in either kidney. Nephrograms are symmetrical. Stomach, small bowel, and colon are not abnormally distended. Surgical clips in the right lower quadrant involving the cecum with ileocolonic anastomosis and previous appendectomy. Diastases of the rectus abdominus muscles with suture material in the anterior abdominal wall. Slight herniation of fat to the left. Previous fluid collection has resolved. No free air or free fluid in the abdomen. Pelvis: Prostate gland is  not enlarged. Bladder wall is not thickened. No free or loculated pelvic fluid collections. No pelvic mass or lymphadenopathy. Calcification in the right pelvis is nonspecific but unchanged since prior study. Mild degenerative changes in the spine. IMPRESSION: Postoperative changes in the abdomen similar to prior study. Diffuse fatty infiltration of the liver. No acute changes. No evidence of obstruction. Electronically Signed   By: WLucienne CapersM.D.   On: 02/29/2016 22:39   Dg Chest Port 1 View  03/01/2016  CLINICAL DATA:  Fever. EXAM: PORTABLE CHEST 1 VIEW COMPARISON:  CT abdomen 02/29/2016.  Chest x-ray 02/20/2011. FINDINGS: Mediastinum hilar structures are normal. Mild bibasilar subsegmental atelectasis and/or scarring noted. Lungs are otherwise clear. Heart size normal. No pleural effusion or pneumothorax. IMPRESSION: Mild bibasilar subsegmental atelectasis and/or scarring. Electronically Signed   By: TMarcello Moores Register   On: 03/01/2016 07:24       Filed Weights   02/29/16 1840 03/01/16 0127  Weight: 53.524 kg (118 lb) 48.2 kg (106 lb 4.2 oz)     Microbiology: No results found for this or any previous visit (from  the past 240 hour(s)).     Blood Culture    Component Value Date/Time   SDES URINE, CLEAN CATCH 05/27/2014 1410   SPECREQUEST NONE 05/27/2014 1410   CULT NO GROWTH Performed at [facility] 05/27/2014 1410   REPTSTATUS 05/28/2014 FINAL 05/27/2014 1410      Labs: Results for orders placed or performed during the hospital encounter of 02/29/16 (from the past 48 hour(s))  Lipase, blood     Status: None   Collection Time: 02/29/16  6:45 PM  Result Value Ref Range   Lipase 17 11 - 51 U/L  Comprehensive metabolic panel     Status: Abnormal   Collection Time: 02/29/16  6:45 PM  Result Value Ref Range   Sodium 139 135 - 145 mmol/L   Potassium 3.6 3.5 - 5.1 mmol/L   Chloride 104 101 - 111 mmol/L   CO2 24 22 - 32 mmol/L   Glucose, Bld 143 (H) 65 - 99 mg/dL    BUN 12 6 - 20 mg/dL   Creatinine, Ser 1.09 0.61 - 1.24 mg/dL   Calcium 9.3 8.9 - 10.3 mg/dL   Total Protein 6.9 6.5 - 8.1 g/dL   Albumin 3.6 3.5 - 5.0 g/dL   AST 17 15 - 41 U/L   ALT 9 (L) 17 - 63 U/L   Alkaline Phosphatase 55 38 - 126 U/L   Total Bilirubin 0.7 0.3 - 1.2 mg/dL   GFR calc non Af Amer >60 >60 mL/min   GFR calc Af Amer >60 >60 mL/min    Comment: (NOTE) The eGFR has been calculated using the CKD EPI equation. This calculation has not been validated in all clinical situations. eGFR's persistently <60 mL/min signify possible Chronic Kidney Disease.    Anion gap 11 5 - 15  CBC with Differential/Platelet     Status: Abnormal   Collection Time: 02/29/16  6:45 PM  Result Value Ref Range   WBC 13.8 (H) 4.0 - 10.5 K/uL   RBC 4.67 4.22 - 5.81 MIL/uL   Hemoglobin 13.6 13.0 - 17.0 g/dL   HCT 40.9 39.0 - 52.0 %   MCV 87.6 78.0 - 100.0 fL   MCH 29.1 26.0 - 34.0 pg   MCHC 33.3 30.0 - 36.0 g/dL   RDW 13.7 11.5 - 15.5 %   Platelets 241 150 - 400 K/uL   Neutrophils Relative % 81 %   Neutro Abs 11.2 (H) 1.7 - 7.7 K/uL   Lymphocytes Relative 16 %   Lymphs Abs 2.1 0.7 - 4.0 K/uL   Monocytes Relative 3 %   Monocytes Absolute 0.4 0.1 - 1.0 K/uL   Eosinophils Relative 0 %   Eosinophils Absolute 0.1 0.0 - 0.7 K/uL   Basophils Relative 0 %   Basophils Absolute 0.0 0.0 - 0.1 K/uL  Urinalysis, Routine w reflex microscopic     Status: Abnormal   Collection Time: 03/01/16  1:06 AM  Result Value Ref Range   Color, Urine YELLOW YELLOW   APPearance CLEAR CLEAR   Specific Gravity, Urine <1.005 (L) 1.005 - 1.030   pH 6.5 5.0 - 8.0   Glucose, UA NEGATIVE NEGATIVE mg/dL   Hgb urine dipstick TRACE (A) NEGATIVE   Bilirubin Urine NEGATIVE NEGATIVE   Ketones, ur 15 (A) NEGATIVE mg/dL   Protein, ur NEGATIVE NEGATIVE mg/dL   Nitrite NEGATIVE NEGATIVE   Leukocytes, UA NEGATIVE NEGATIVE  Urine microscopic-add on     Status: Abnormal   Collection Time: 03/01/16  1:06 AM  Result  Value Ref  Range   Squamous Epithelial / LPF 0-5 (A) NONE SEEN   WBC, UA 0-5 0 - 5 WBC/hpf   RBC / HPF 0-5 0 - 5 RBC/hpf   Bacteria, UA RARE (A) NONE SEEN  Comprehensive metabolic panel     Status: Abnormal   Collection Time: 03/01/16  4:55 AM  Result Value Ref Range   Sodium 137 135 - 145 mmol/L   Potassium 4.1 3.5 - 5.1 mmol/L   Chloride 103 101 - 111 mmol/L   CO2 28 22 - 32 mmol/L   Glucose, Bld 156 (H) 65 - 99 mg/dL   BUN 10 6 - 20 mg/dL   Creatinine, Ser 0.93 0.61 - 1.24 mg/dL   Calcium 8.6 (L) 8.9 - 10.3 mg/dL   Total Protein 6.1 (L) 6.5 - 8.1 g/dL   Albumin 3.2 (L) 3.5 - 5.0 g/dL   AST 16 15 - 41 U/L   ALT 9 (L) 17 - 63 U/L   Alkaline Phosphatase 48 38 - 126 U/L   Total Bilirubin 0.5 0.3 - 1.2 mg/dL   GFR calc non Af Amer >60 >60 mL/min   GFR calc Af Amer >60 >60 mL/min    Comment: (NOTE) The eGFR has been calculated using the CKD EPI equation. This calculation has not been validated in all clinical situations. eGFR's persistently <60 mL/min signify possible Chronic Kidney Disease.    Anion gap 6 5 - 15  CBC WITH DIFFERENTIAL     Status: Abnormal   Collection Time: 03/01/16  4:55 AM  Result Value Ref Range   WBC 10.5 4.0 - 10.5 K/uL   RBC 4.34 4.22 - 5.81 MIL/uL   Hemoglobin 12.5 (L) 13.0 - 17.0 g/dL   HCT 38.4 (L) 39.0 - 52.0 %   MCV 88.5 78.0 - 100.0 fL   MCH 28.8 26.0 - 34.0 pg   MCHC 32.6 30.0 - 36.0 g/dL   RDW 13.5 11.5 - 15.5 %   Platelets 192 150 - 400 K/uL   Neutrophils Relative % 80 %   Neutro Abs 8.4 (H) 1.7 - 7.7 K/uL   Lymphocytes Relative 15 %   Lymphs Abs 1.6 0.7 - 4.0 K/uL   Monocytes Relative 5 %   Monocytes Absolute 0.6 0.1 - 1.0 K/uL   Eosinophils Relative 0 %   Eosinophils Absolute 0.0 0.0 - 0.7 K/uL   Basophils Relative 0 %   Basophils Absolute 0.0 0.0 - 0.1 K/uL  Glucose, capillary     Status: Abnormal   Collection Time: 03/01/16  5:59 AM  Result Value Ref Range   Glucose-Capillary 170 (H) 65 - 99 mg/dL   Comment 1 Notify RN    Comment 2  Document in Chart      Lipid Panel  No results found for: CHOL, TRIG, HDL, CHOLHDL, VLDL, LDLCALC, LDLDIRECT   No results found for: HGBA1C   Lab Results  Component Value Date   CREATININE 0.93 03/01/2016     HPI :  Jonathon Turner is a 65 y.o. male with medical history significant of previous multiple surgeries for shrapnel removal and also had surgeries for hernia and subsequently infected mesh removal presents to the ER because of multiple episodes of nausea vomiting with generalized abdominal pain since yesterday morning. Patient's last bowel movement was more than 36 hours ago. Patient states his bowel movements are erratic due to his chronic abdominal problem. Denies any blood in the vomitus. In the ER CT abdomen and pelvis does not  show anything acute but since patient has had multiple episodes of vomiting would be admitted for further observation given the previous history of small bowel obstructions.   HOSPITAL COURSE:   Abdominal pain with intractable nausea vomiting for 36 hours prior to admission Suspect that this is secondary to viral gastroenteritis Patient also complains of extensive weight loss and inability to eat normal meals secondary to chronic abdominal pain Patient also complains of early satiety and exacerbation of pain with every meal Suspect component of gastroparesis due to chronic narcotics Patient has been started on Reglan Reviewed results of CT scan abdomen and pelvis with the patient and his wife which was essentially negative [place] negative Patient has been advanced to a soft diet which he tolerated well prior to discharge Patient was aggressively hydrated and his nausea and vomiting resolved prior to discharge  Discharge Exam:    Blood pressure 147/79, pulse 96, temperature 98.2 F (36.8 C), temperature source Oral, resp. rate 16, height 5' 6"  (1.676 m), weight 48.2 kg (106 lb 4.2 oz), SpO2 100 %.  Respiratory: No rhonchi or  crepitations. Cardiovascular: S1 and S2 heard. Abdomen: Soft nontender bowel sounds present. No guarding or rigidity. Multiple scars seen. Musculoskeletal: No edema. Skin: Multiple scars on the abdomen. Neurologic: Alert awake oriented to time place and person. Moves all extremities. Psychiatric: Appears normal.    Follow-up Information    Follow up with Primary care provider. Schedule an appointment as soon as possible for a visit in 3 days.   Why:  Hospital follow-up      Signed: Adrienna Karis 03/01/2016, 10:00 AM        Time spent >45 mins
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
## 2016-03-05 ENCOUNTER — Emergency Department (HOSPITAL_COMMUNITY): Payer: Medicare Other

## 2016-03-05 ENCOUNTER — Encounter (HOSPITAL_COMMUNITY): Payer: Self-pay

## 2016-03-05 ENCOUNTER — Inpatient Hospital Stay (HOSPITAL_COMMUNITY)
Admission: EM | Admit: 2016-03-05 | Discharge: 2016-03-07 | DRG: 392 | Disposition: A | Discharge status: Home / Self Care | Payer: Medicare Other | Attending: Internal Medicine | Admitting: Internal Medicine

## 2016-03-05 DIAGNOSIS — F4310 Post-traumatic stress disorder, unspecified: Secondary | ICD-10-CM | POA: Diagnosis present

## 2016-03-05 DIAGNOSIS — A09 Infectious gastroenteritis and colitis, unspecified: Principal | ICD-10-CM | POA: Diagnosis present

## 2016-03-05 DIAGNOSIS — K529 Noninfective gastroenteritis and colitis, unspecified: Secondary | ICD-10-CM | POA: Diagnosis present

## 2016-03-05 DIAGNOSIS — Z933 Colostomy status: Secondary | ICD-10-CM

## 2016-03-05 DIAGNOSIS — Z8249 Family history of ischemic heart disease and other diseases of the circulatory system: Secondary | ICD-10-CM

## 2016-03-05 DIAGNOSIS — N400 Enlarged prostate without lower urinary tract symptoms: Secondary | ICD-10-CM | POA: Diagnosis not present

## 2016-03-05 DIAGNOSIS — Z7982 Long term (current) use of aspirin: Secondary | ICD-10-CM

## 2016-03-05 DIAGNOSIS — K5909 Other constipation: Secondary | ICD-10-CM | POA: Diagnosis present

## 2016-03-05 DIAGNOSIS — K219 Gastro-esophageal reflux disease without esophagitis: Secondary | ICD-10-CM | POA: Diagnosis present

## 2016-03-05 DIAGNOSIS — R109 Unspecified abdominal pain: Secondary | ICD-10-CM | POA: Diagnosis not present

## 2016-03-05 DIAGNOSIS — Z9049 Acquired absence of other specified parts of digestive tract: Secondary | ICD-10-CM

## 2016-03-05 DIAGNOSIS — G8929 Other chronic pain: Secondary | ICD-10-CM | POA: Diagnosis present

## 2016-03-05 DIAGNOSIS — Z79891 Long term (current) use of opiate analgesic: Secondary | ICD-10-CM

## 2016-03-05 LAB — COMPREHENSIVE METABOLIC PANEL
ALT: 10 U/L — ABNORMAL LOW (ref 17–63)
AST: 15 U/L (ref 15–41)
Albumin: 3.2 g/dL — ABNORMAL LOW (ref 3.5–5.0)
Alkaline Phosphatase: 46 U/L (ref 38–126)
Anion gap: 5 (ref 5–15)
BUN: 9 mg/dL (ref 6–20)
CO2: 27 mmol/L (ref 22–32)
Calcium: 8.5 mg/dL — ABNORMAL LOW (ref 8.9–10.3)
Chloride: 106 mmol/L (ref 101–111)
Creatinine, Ser: 1.08 mg/dL (ref 0.61–1.24)
GFR calc Af Amer: >60 mL/min (ref >60)
GFR calc non Af Amer: >60 mL/min (ref >60)
Glucose, Bld: 101 mg/dL — ABNORMAL HIGH (ref 65–99)
Potassium: 3.5 mmol/L (ref 3.5–5.1)
Sodium: 138 mmol/L (ref 135–145)
Total Bilirubin: 0.3 mg/dL (ref 0.3–1.2)
Total Protein: 5.9 g/dL — ABNORMAL LOW (ref 6.5–8.1)

## 2016-03-05 LAB — URINALYSIS, ROUTINE W REFLEX MICROSCOPIC
BILIRUBIN URINE: NEGATIVE
GLUCOSE, UA: NEGATIVE mg/dL
Hgb urine dipstick: NEGATIVE
KETONES UR: NEGATIVE mg/dL
Leukocytes, UA: NEGATIVE
Nitrite: NEGATIVE
PH: 6 (ref 5.0–8.0)
Protein, ur: NEGATIVE mg/dL
SPECIFIC GRAVITY, URINE: 1.015 (ref 1.005–1.030)

## 2016-03-05 LAB — I-STAT CG4 LACTIC ACID, ED: Lactic Acid, Venous: 0.9 mmol/L (ref 0.5–2.0)

## 2016-03-05 LAB — CBC WITH DIFFERENTIAL/PLATELET
BASOS ABS: 0.1 10*3/uL (ref 0.0–0.1)
BASOS PCT: 1 %
Eosinophils Absolute: 0.3 10*3/uL (ref 0.0–0.7)
Eosinophils Relative: 3 %
HEMATOCRIT: 38.3 % — AB (ref 39.0–52.0)
HEMOGLOBIN: 13 g/dL (ref 13.0–17.0)
Lymphocytes Relative: 23 %
Lymphs Abs: 2.4 10*3/uL (ref 0.7–4.0)
MCH: 29.7 pg (ref 26.0–34.0)
MCHC: 33.9 g/dL (ref 30.0–36.0)
MCV: 87.6 fL (ref 78.0–100.0)
MONOS PCT: 5 %
Monocytes Absolute: 0.5 10*3/uL (ref 0.1–1.0)
NEUTROS ABS: 6.9 10*3/uL (ref 1.7–7.7)
NEUTROS PCT: 68 %
Platelets: 195 10*3/uL (ref 150–400)
RBC: 4.37 MIL/uL (ref 4.22–5.81)
RDW: 13.4 % (ref 11.5–15.5)
WBC: 10.1 10*3/uL (ref 4.0–10.5)

## 2016-03-05 LAB — LIPASE, BLOOD: Lipase: 21 U/L (ref 11–51)

## 2016-03-05 MED ORDER — ONDANSETRON HCL 4 MG/2ML IJ SOLN
4.0000 mg | Freq: Once | INTRAMUSCULAR | Status: AC
  Administered 2016-03-05: 4 mg via INTRAVENOUS
  Filled 2016-03-05: qty 2

## 2016-03-05 MED ORDER — HYDROMORPHONE HCL 1 MG/ML IJ SOLN: 1.0000 mg | Freq: Once | INTRAMUSCULAR | Status: DC

## 2016-03-05 MED ORDER — DOCUSATE SODIUM 100 MG PO CAPS
200.0000 mg | ORAL_CAPSULE | Freq: Two times a day (BID) | ORAL | Status: DC
  Administered 2016-03-05 – 2016-03-07 (×4): 200 mg via ORAL
  Filled 2016-03-05 (×4): qty 2

## 2016-03-05 MED ORDER — OXYCODONE-ACETAMINOPHEN 5-325 MG PO TABS
1.0000 | ORAL_TABLET | ORAL | Status: DC | PRN
  Administered 2016-03-05 – 2016-03-06 (×2): 2 via ORAL
  Administered 2016-03-06: 1 via ORAL
  Administered 2016-03-06 – 2016-03-07 (×2): 2 via ORAL
  Filled 2016-03-05 (×5): qty 2

## 2016-03-05 MED ORDER — ACETAMINOPHEN 650 MG RE SUPP: 650.0000 mg | Freq: Four times a day (QID) | RECTAL | Status: DC | PRN

## 2016-03-05 MED ORDER — PANTOPRAZOLE SODIUM 40 MG PO TBEC
40.0000 mg | DELAYED_RELEASE_TABLET | Freq: Every day | ORAL | Status: DC
  Administered 2016-03-06 – 2016-03-07 (×2): 40 mg via ORAL
  Filled 2016-03-05 (×2): qty 1

## 2016-03-05 MED ORDER — LORAZEPAM 2 MG/ML IJ SOLN
1.0000 mg | Freq: Once | INTRAMUSCULAR | Status: AC
  Administered 2016-03-05: 1 mg via INTRAVENOUS
  Filled 2016-03-05: qty 1

## 2016-03-05 MED ORDER — FINASTERIDE 5 MG PO TABS
5.0000 mg | ORAL_TABLET | Freq: Every day | ORAL | Status: DC
  Administered 2016-03-06 – 2016-03-07 (×2): 5 mg via ORAL
  Filled 2016-03-05 (×3): qty 1

## 2016-03-05 MED ORDER — ONDANSETRON HCL 4 MG PO TABS: 4.0000 mg | ORAL_TABLET | Freq: Four times a day (QID) | ORAL | Status: DC | PRN

## 2016-03-05 MED ORDER — HYDROMORPHONE HCL 1 MG/ML IJ SOLN
1.0000 mg | Freq: Once | INTRAMUSCULAR | Status: AC
  Administered 2016-03-05: 1 mg via INTRAVENOUS
  Filled 2016-03-05: qty 1

## 2016-03-05 MED ORDER — SODIUM CHLORIDE 0.9 % IV SOLN
Freq: Once | INTRAVENOUS | Status: AC
  Administered 2016-03-05: 14:00:00 via INTRAVENOUS

## 2016-03-05 MED ORDER — SODIUM CHLORIDE 0.9 % IV BOLUS (SEPSIS)
1000.0000 mL | Freq: Once | INTRAVENOUS | Status: AC
  Administered 2016-03-05: 1000 mL via INTRAVENOUS

## 2016-03-05 MED ORDER — IOPAMIDOL (ISOVUE-300) INJECTION 61%
100.0000 mL | Freq: Once | INTRAVENOUS | Status: AC | PRN
  Administered 2016-03-05: 100 mL via INTRAVENOUS

## 2016-03-05 MED ORDER — AMITRIPTYLINE HCL 25 MG PO TABS
150.0000 mg | ORAL_TABLET | Freq: Every day | ORAL | Status: DC
  Administered 2016-03-05 – 2016-03-06 (×2): 150 mg via ORAL
  Filled 2016-03-05 (×2): qty 6

## 2016-03-05 MED ORDER — FENTANYL CITRATE (PF) 100 MCG/2ML IJ SOLN
100.0000 ug | Freq: Once | INTRAMUSCULAR | Status: AC
  Administered 2016-03-05: 100 ug via INTRAVENOUS
  Filled 2016-03-05: qty 2

## 2016-03-05 MED ORDER — METHADONE HCL 10 MG PO TABS
5.0000 mg | ORAL_TABLET | Freq: Three times a day (TID) | ORAL | Status: DC
  Administered 2016-03-05 – 2016-03-07 (×6): 5 mg via ORAL
  Filled 2016-03-05 (×6): qty 1

## 2016-03-05 MED ORDER — ENOXAPARIN SODIUM 40 MG/0.4ML ~~LOC~~ SOLN
40.0000 mg | SUBCUTANEOUS | Status: DC
  Administered 2016-03-05 – 2016-03-06 (×2): 40 mg via SUBCUTANEOUS
  Filled 2016-03-05 (×2): qty 0.4

## 2016-03-05 MED ORDER — ONDANSETRON HCL 4 MG/2ML IJ SOLN
4.0000 mg | Freq: Four times a day (QID) | INTRAMUSCULAR | Status: DC | PRN
  Administered 2016-03-06: 4 mg via INTRAVENOUS
  Filled 2016-03-05: qty 2

## 2016-03-05 MED ORDER — ACETAMINOPHEN 325 MG PO TABS: 650.0000 mg | ORAL_TABLET | Freq: Four times a day (QID) | ORAL | Status: DC | PRN

## 2016-03-05 MED ORDER — SODIUM CHLORIDE 0.9 % IV SOLN
INTRAVENOUS | Status: AC
  Administered 2016-03-05: 18:00:00 via INTRAVENOUS

## 2016-03-05 MED ORDER — ZOLPIDEM TARTRATE 5 MG PO TABS
5.0000 mg | ORAL_TABLET | Freq: Every evening | ORAL | Status: DC | PRN
  Filled 2016-03-05: qty 1

## 2016-03-05 MED ORDER — SENNOSIDES-DOCUSATE SODIUM 8.6-50 MG PO TABS
2.0000 | ORAL_TABLET | Freq: Two times a day (BID) | ORAL | Status: DC
  Administered 2016-03-05 – 2016-03-07 (×4): 2 via ORAL
  Filled 2016-03-05 (×4): qty 2

## 2016-03-05 MED ORDER — METRONIDAZOLE IN NACL 5-0.79 MG/ML-% IV SOLN
500.0000 mg | Freq: Three times a day (TID) | INTRAVENOUS | Status: DC
  Administered 2016-03-06 – 2016-03-07 (×4): 500 mg via INTRAVENOUS
  Filled 2016-03-05 (×4): qty 100

## 2016-03-05 MED ORDER — ASPIRIN EC 81 MG PO TBEC
81.0000 mg | DELAYED_RELEASE_TABLET | Freq: Every evening | ORAL | Status: DC
  Filled 2016-03-05: qty 1

## 2016-03-05 MED ORDER — TAMSULOSIN HCL 0.4 MG PO CAPS
0.4000 mg | ORAL_CAPSULE | Freq: Every evening | ORAL | Status: DC
  Administered 2016-03-06: 0.4 mg via ORAL
  Filled 2016-03-05: qty 1

## 2016-03-05 MED ORDER — GABAPENTIN 300 MG PO CAPS
600.0000 mg | ORAL_CAPSULE | Freq: Every day | ORAL | Status: DC
  Administered 2016-03-05 – 2016-03-06 (×2): 600 mg via ORAL
  Filled 2016-03-05 (×2): qty 2

## 2016-03-05 MED ORDER — DIATRIZOATE MEGLUMINE & SODIUM 66-10 % PO SOLN
ORAL | Status: AC
  Filled 2016-03-05: qty 30

## 2016-03-05 MED ORDER — METRONIDAZOLE IN NACL 5-0.79 MG/ML-% IV SOLN
500.0000 mg | Freq: Once | INTRAVENOUS | Status: AC
  Administered 2016-03-05: 500 mg via INTRAVENOUS
  Filled 2016-03-05: qty 100

## 2016-03-05 MED ORDER — CIPROFLOXACIN IN D5W 400 MG/200ML IV SOLN
400.0000 mg | Freq: Once | INTRAVENOUS | Status: AC
  Administered 2016-03-05: 400 mg via INTRAVENOUS
  Filled 2016-03-05: qty 200

## 2016-03-05 MED ORDER — SODIUM CHLORIDE 0.9 % IV SOLN
INTRAVENOUS | Status: AC
  Administered 2016-03-05: 21:00:00 via INTRAVENOUS

## 2016-03-05 MED ORDER — TRAZODONE HCL 50 MG PO TABS
50.0000 mg | ORAL_TABLET | Freq: Every day | ORAL | Status: DC
Start: 2016-03-05 — End: 2016-03-07
  Administered 2016-03-05 – 2016-03-06 (×2): 50 mg via ORAL
  Filled 2016-03-05 (×2): qty 1

## 2016-03-05 MED ORDER — LORAZEPAM 2 MG/ML IJ SOLN
0.5000 mg | Freq: Four times a day (QID) | INTRAMUSCULAR | Status: DC | PRN
  Administered 2016-03-05 – 2016-03-07 (×3): 0.5 mg via INTRAVENOUS
  Filled 2016-03-05 (×4): qty 1

## 2016-03-05 MED ORDER — CIPROFLOXACIN IN D5W 400 MG/200ML IV SOLN
400.0000 mg | Freq: Two times a day (BID) | INTRAVENOUS | Status: DC
  Administered 2016-03-06 – 2016-03-07 (×3): 400 mg via INTRAVENOUS
  Filled 2016-03-05 (×3): qty 200

## 2016-03-05 MED ORDER — CLONAZEPAM 0.5 MG PO TABS
0.2500 mg | ORAL_TABLET | Freq: Every morning | ORAL | Status: DC
  Administered 2016-03-06 – 2016-03-07 (×2): 0.25 mg via ORAL
  Filled 2016-03-05 (×2): qty 1

## 2016-03-05 NOTE — ED Notes (Signed)
Patient's full bottle of contrast sitting on bedside table. Patient refuses to drink contrast due to severe abdominal pain. Advised Dr Adriana Simasook. Verbal order to perform CT without oral contrast.

## 2016-03-05 NOTE — ED Notes (Signed)
Patient complaining of returning pain. Verbal order from Dr Adriana Simasook for fentanyl 100 mcg IV to be given.
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
## 2016-03-05 NOTE — ED Notes (Signed)
Patient states pain medication has been uneffective. Rates pain at 10/10. Verbal order for dilaudid, 1 mg, IV to be given.

## 2016-03-05 NOTE — ED Provider Notes (Signed)
Care assumed from Dr. Adriana Simasook. Patient with chronic abdominal pain, worse over the past several days.  assocaited with nausea, no vomiting or diarrhea.  Crying, rocking in pain.  TTP diffusely, midline scar.  Soft. No guarding or reboudn.  CT today with trace free pelvic fluid and colitis of transverse colon. Pain uncontrolled.  Will check lactate.  Lactate normal at 0.9. Patient uncomfortable with intractable pain. He is given IV Ativan with some improvement. Admission discussed with Dr. Adrian BlackwaterStinson.  Jonathon OctaveStephen Daviel Allegretto, MD 03/05/16 [phone]

## 2016-03-05 NOTE — ED Notes (Signed)
Pt reports pt was here last Sunday for abd pain and was discharged Monday.  PT started having abd pain again this morning around 3am.  Pt reports nausea, no vomiting.  LBM was yesterday morning but was small.
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
## 2016-03-06 DIAGNOSIS — G8929 Other chronic pain: Secondary | ICD-10-CM | POA: Diagnosis present

## 2016-03-06 DIAGNOSIS — N400 Enlarged prostate without lower urinary tract symptoms: Secondary | ICD-10-CM

## 2016-03-06 DIAGNOSIS — Z9049 Acquired absence of other specified parts of digestive tract: Secondary | ICD-10-CM | POA: Diagnosis not present

## 2016-03-06 DIAGNOSIS — R1084 Generalized abdominal pain: Secondary | ICD-10-CM | POA: Diagnosis not present

## 2016-03-06 DIAGNOSIS — K219 Gastro-esophageal reflux disease without esophagitis: Secondary | ICD-10-CM | POA: Diagnosis present

## 2016-03-06 DIAGNOSIS — Z79891 Long term (current) use of opiate analgesic: Secondary | ICD-10-CM | POA: Diagnosis not present

## 2016-03-06 DIAGNOSIS — A09 Infectious gastroenteritis and colitis, unspecified: Secondary | ICD-10-CM | POA: Diagnosis not present

## 2016-03-06 DIAGNOSIS — Z7982 Long term (current) use of aspirin: Secondary | ICD-10-CM | POA: Diagnosis not present

## 2016-03-06 DIAGNOSIS — R109 Unspecified abdominal pain: Secondary | ICD-10-CM | POA: Diagnosis not present

## 2016-03-06 DIAGNOSIS — F4310 Post-traumatic stress disorder, unspecified: Secondary | ICD-10-CM | POA: Diagnosis present

## 2016-03-06 DIAGNOSIS — Z933 Colostomy status: Secondary | ICD-10-CM | POA: Diagnosis not present

## 2016-03-06 DIAGNOSIS — Z8249 Family history of ischemic heart disease and other diseases of the circulatory system: Secondary | ICD-10-CM | POA: Diagnosis not present

## 2016-03-06 DIAGNOSIS — K5909 Other constipation: Secondary | ICD-10-CM | POA: Diagnosis present

## 2016-03-06 LAB — BASIC METABOLIC PANEL
Anion gap: 4 — ABNORMAL LOW (ref 5–15)
BUN: 8 mg/dL (ref 6–20)
CHLORIDE: 107 mmol/L (ref 101–111)
CO2: 26 mmol/L (ref 22–32)
Calcium: 7.9 mg/dL — ABNORMAL LOW (ref 8.9–10.3)
Creatinine, Ser: 0.84 mg/dL (ref 0.61–1.24)
GFR calc Af Amer: >60 mL/min (ref >60)
Glucose, Bld: 85 mg/dL (ref 65–99)
Potassium: 3.7 mmol/L (ref 3.5–5.1)
Sodium: 137 mmol/L (ref 135–145)

## 2016-03-06 LAB — CBC
HEMATOCRIT: 36.3 % — AB (ref 39.0–52.0)
Hemoglobin: 12.4 g/dL — ABNORMAL LOW (ref 13.0–17.0)
MCH: 30.2 pg (ref 26.0–34.0)
MCHC: 34.2 g/dL (ref 30.0–36.0)
MCV: 88.3 fL (ref 78.0–100.0)
Platelets: 181 10*3/uL (ref 150–400)
RBC: 4.11 MIL/uL — AB (ref 4.22–5.81)
RDW: 13.5 % (ref 11.5–15.5)
WBC: 10.5 10*3/uL (ref 4.0–10.5)

## 2016-03-06 MED ORDER — SODIUM CHLORIDE 0.9 % IV SOLN: INTRAVENOUS | Status: DC

## 2016-03-06 NOTE — Progress Notes (Signed)
PROGRESS NOTE                                                                                                                                                                                                             Patient Demographics:    Jonathon Turner, is a 65 y.o. male, DOB - 11/24/1950, ZOX:[id number]  Admit date - 03/05/2016   Admitting Physician Levie Heritage, DO  Outpatient Primary MD for the patient is Pcp Not In System  LOS -   Chief Complaint  Patient presents with  . Abdominal Pain       Brief Narrative  65 year old male with history of chronic abdominal pain on methadone secondary to multiple surgeries in the past, presents with abdominal pain, CT abdomen and pelvis with questionable colitis, admitted for IV antibiotics and pain control.   Subjective:    Jonathon Turner today has, No headache, No chest pain, Still complains of abdominal pain, reports mild nausea, but no vomiting.   Assessment  & Plan :    Active Problems:   BPH (benign prostatic hyperplasia)   Intractable abdominal pain   Colitis presumed infectious  Abdominal pain - Patient with known history of chronic abdominal pain on methadone. - In ED Patient improved after receiving Ativan. - CT abdomen and pelvis with suspicion of colitis, presumed infectious, will continue with IV Cipro and Flagyl. - Patient is tolerating full liquid diet, will advance to soft diet, if he tolerates, hopefully can be discharged tomorrow on by mouth antibiotics.  BPH - Continue with home medication    Code Status : Full  Family Communication  : Wife at bedside  Disposition Plan  : Home in 24 hour tolerating soft diet  Consults  :  none  Procedures  : none  DVT Prophylaxis  :  Lovenox   Lab Results  Component Value Date   PLT 181 03/06/2016    Antibiotics  :    Anti-infectives    Start     Dose/Rate Route Frequency Ordered Stop   03/06/16 0600  ciprofloxacin (CIPRO)  IVPB 400 mg     400 mg 200 mL/hr over 60 Minutes Intravenous Every 12 hours 03/05/16 2058     03/06/16 0300  metroNIDAZOLE (FLAGYL) IVPB 500 mg     500 mg 100 mL/hr over 60 Minutes Intravenous Every 8  hours 03/05/16 2058     03/05/16 1700  ciprofloxacin (CIPRO) IVPB 400 mg     400 mg 200 mL/hr over 60 Minutes Intravenous  Once 03/05/16 1659 03/05/16 1848   03/05/16 1700  metroNIDAZOLE (FLAGYL) IVPB 500 mg     500 mg 100 mL/hr over 60 Minutes Intravenous  Once 03/05/16 1659 03/05/16 2006        Objective:   Filed Vitals:   03/05/16 1900 03/05/16 1930 03/05/16 2139 03/06/16 0629  BP: 151/95 158/85 140/85 110/93  Pulse: 95 101 102 86  Temp:   99.5 F (37.5 C) 98.3 F (36.8 C)  TempSrc:   Oral Oral  Resp:  Height:      Weight:      SpO2: 98% 97% 97% 97%    Wt Readings from Last 3 Encounters:  03/05/16 48.081 kg (106 lb)  03/01/16 48.2 kg (106 lb 4.2 oz)  05/27/14 55.611 kg (122 lb 9.6 oz)    No intake or output data in the 24 hours ending 03/06/16 1259   Physical Exam  Awake Alert, Oriented X 3 Supple Neck,No JVD Symmetrical Chest wall movement, Good air movement bilaterally, CTAB RRR,No Gallops,Rubs or new Murmurs, No Parasternal Heave +ve B.Sounds, Abd Soft,Mild diffuse tenderness, midline surgical scarring, No rebound - guarding or rigidity. No Cyanosis, Clubbing or edema, No new Rash or bruise      Data Review:    CBC  Recent Labs Lab 02/29/16 1845 03/01/16 0455 03/05/16 0935 03/06/16 0549  WBC 13.8* 10.5 10.1 10.5  HGB 13.6 12.5* 13.0 12.4*  HCT 40.9 38.4* 38.3* 36.3*  PLT 241 192 195 181  MCV 87.6 88.5 87.6 88.3  MCH 29.1 28.8 29.7 30.2  MCHC 33.3 32.6 33.9 34.2  RDW 13.7 13.5 13.4 13.5  LYMPHSABS 2.1 1.6 2.4  --   MONOABS 0.4 0.6 0.5  --   EOSABS 0.1 0.0 0.3  --   BASOSABS 0.0 0.0 0.1  --     Chemistries   Recent Labs Lab 02/29/16 1845 03/01/16 0455 03/05/16 0935 03/06/16 0549  NA 139 137 138 137  K 3.6 4.1 3.5 3.7  CL  104 103 106 107  CO2 GLUCOSE 143* 156* 101* 85  BUN CREATININE 1.09 0.93 1.08 0.84  CALCIUM 9.3 8.6* 8.5* 7.9*  AST --   ALT 9* 9* 10*  --   ALKPHOS 55 48 46  --   BILITOT 0.7 0.5 0.3  --    ------------------------------------------------------------------------------------------------------------------ No results for input(s): CHOL, HDL, LDLCALC, TRIG, CHOLHDL, LDLDIRECT in the last 72 hours.  No results found for: HGBA1C ------------------------------------------------------------------------------------------------------------------ No results for input(s): TSH, T4TOTAL, T3FREE, THYROIDAB in the last 72 hours.  Invalid input(s): FREET3 ------------------------------------------------------------------------------------------------------------------ No results for input(s): VITAMINB12, FOLATE, FERRITIN, TIBC, IRON, RETICCTPCT in the last 72 hours.  Coagulation profile No results for input(s): INR, PROTIME in the last 168 hours.  No results for input(s): DDIMER in the last 72 hours.  Cardiac Enzymes No results for input(s): CKMB, TROPONINI, MYOGLOBIN in the last 168 hours.  Invalid input(s): CK ------------------------------------------------------------------------------------------------------------------ No results found for: BNP  Inpatient Medications  Scheduled Meds: . amitriptyline  150 mg Oral QHS  . aspirin EC  81 mg Oral QPM  . ciprofloxacin  400 mg Intravenous Q12H  . clonazePAM  0.25 mg Oral q morning - 10a  . docusate sodium  200 mg Oral BID  . enoxaparin (  LOVENOX) injection  40 mg Subcutaneous Q24H  . finasteride  5 mg Oral Daily  . gabapentin  600 mg Oral QHS  . methadone  5 mg Oral Q8H  . metronidazole  500 mg Intravenous Q8H  . pantoprazole  40 mg Oral Daily  . senna-docusate  2 tablet Oral BID  . tamsulosin  0.4 mg Oral QPM  . traZODone  50 mg Oral QHS   Continuous Infusions:  PRN Meds:.acetaminophen **OR**  acetaminophen, LORazepam, ondansetron **OR** ondansetron (ZOFRAN) IV, oxyCODONE-acetaminophen, zolpidem  Micro Results No results found for this or any previous visit (from the past 240 hour(s)).  Radiology Reports Ct Abdomen Pelvis W Contrast  03/05/2016  CLINICAL DATA:  65 year old male with recurrent abdominal pain with nausea, no vomiting. Personal history of bowel obstructions. Subsequent encounter. EXAM: CT ABDOMEN AND PELVIS WITH CONTRAST TECHNIQUE: Multidetector CT imaging of the abdomen and pelvis was performed using the standard protocol following bolus administration of intravenous contrast. CONTRAST:  ISOVUE-300 IOPAMIDOL (ISOVUE-300) INJECTION 61% COMPARISON:  CT Abdomen and Pelvis 02/29/2016 and earlier. FINDINGS: Stable lung bases; slightly increased dependent atelectasis on the right. No pericardial or pleural effusion. No acute osseous abnormality identified. There is trace pelvic free fluid on series 2, image 69 which is new. Negative rectum. Unremarkable urinary bladder. Negative sigmoid colon aside from retained stool. Negative left colon. The transverse colon is much more decompressed today which is felt to account for the appearance of mild wall thickening. A large small bowel anastomosis is re - demonstrated in the right abdomen. The upstream small bowel loops are mildly gas-filled as before with no wall thickening. No dilated small bowel elsewhere. No abdominal free air or free fluid. Negative stomach and duodenum. Liver, gallbladder, spleen, pancreas and adrenal glands are stable and within normal limits. Portal venous system is patent. Aortoiliac calcified atherosclerosis noted. Major arterial structures are patent. Renal enhancement and contrast excretion is stable and within normal limits. Benign appearing right lower pole renal cyst again noted. No lymphadenopathy. Retained metallic foreign body along the right iliopsoas muscle is unchanged at the pelvic inlet. Stable  ventral abdominal wall postoperative changes. IMPRESSION: 1. Trace pelvic free fluid is new since 02/29/2016 and abnormal but nonspecific. No inflammatory process identified in the pelvis. 2. New mild wall thickening throughout the transverse colon such that mild colitis is difficult to exclude. 3. Otherwise stable abdomen and pelvis. Electronically Signed   By: Odessa Fleming M.D.   On: 03/05/2016 16:45   Ct Abdomen Pelvis W Contrast  02/29/2016  CLINICAL DATA:  Sharp lower abdominal pain with nausea and vomiting beginning yesterday. Diaphoresis. Previous history of bowel obstructions. EXAM: CT ABDOMEN AND PELVIS WITH CONTRAST TECHNIQUE: Multidetector CT imaging of the abdomen and pelvis was performed using the standard protocol following bolus administration of intravenous contrast. CONTRAST:  ISOVUE-300 IOPAMIDOL (ISOVUE-300) INJECTION 61% COMPARISON:  05/27/2014 FINDINGS: Slight fibrosis in the lung bases. Mild diffuse fatty infiltration of the liver. Tiny sub cm low-attenuation lesions in the liver are likely represent small cysts or hemangiomas but are too small to characterize. No change since prior study. The gallbladder, spleen, pancreas, adrenal glands, inferior vena cava, and retroperitoneal lymph nodes are unremarkable. Calcification of the abdominal aorta. Cyst in the lower pole of the right kidney. No hydronephrosis in either kidney. Nephrograms are symmetrical. Stomach, small bowel, and colon are not abnormally distended. Surgical clips in the right lower quadrant involving the cecum with ileocolonic anastomosis and previous appendectomy. Diastases of the rectus abdominus muscles  with suture material in the anterior abdominal wall. Slight herniation of fat to the left. Previous fluid collection has resolved. No free air or free fluid in the abdomen. Pelvis: Prostate gland is not enlarged. Bladder wall is not thickened. No free or loculated pelvic fluid collections. No pelvic mass or lymphadenopathy.  Calcification in the right pelvis is nonspecific but unchanged since prior study. Mild degenerative changes in the spine. IMPRESSION: Postoperative changes in the abdomen similar to prior study. Diffuse fatty infiltration of the liver. No acute changes. No evidence of obstruction. Electronically Signed   By: Burman NievesWilliam  Stevens M.D.   On: 02/29/2016 22:39   Dg Chest Port 1 View  03/01/2016  CLINICAL DATA:  Fever. EXAM: PORTABLE CHEST 1 VIEW COMPARISON:  CT abdomen 02/29/2016.  Chest x-ray 02/20/2011. FINDINGS: Mediastinum hilar structures are normal. Mild bibasilar subsegmental atelectasis and/or scarring noted. Lungs are otherwise clear. Heart size normal. No pleural effusion or pneumothorax. IMPRESSION: Mild bibasilar subsegmental atelectasis and/or scarring. Electronically Signed   By: Maisie Fushomas  Register   On: 03/01/2016 07:24   Dg Abd Acute W/chest  03/05/2016  CLINICAL DATA:  Abdominal pain EXAM: DG ABDOMEN ACUTE W/ 1V CHEST COMPARISON:  02/29/2016 FINDINGS: Cardiac shadow is within normal limits. The lungs are well aerated bilaterally. No focal infiltrate or sizable effusion is seen. Calcified granuloma is noted in the left apex. Bilateral nipple shadows are noted. Postsurgical changes are noted consistent with the given clinical history. Scattered large and small bowel gas is again identified. No free air is noted. No abnormal mass lesion is seen. IMPRESSION: No acute abnormality is noted per Electronically Signed   By: Alcide CleverMark  Lukens M.D.   On: 03/05/2016 10:01    Time Spent in minutes  25 minutes   Bayan Kushnir M.D on 03/06/2016 at 12:59 PM  Between 7am to 7pm - Pager - [phone]  After 7pm go to [email or web address] - password [email or web address]  [facility] -  Office  [phone]

## 2016-03-07 DIAGNOSIS — R109 Unspecified abdominal pain: Secondary | ICD-10-CM

## 2016-03-07 DIAGNOSIS — A09 Infectious gastroenteritis and colitis, unspecified: Principal | ICD-10-CM

## 2016-03-07 DIAGNOSIS — R1084 Generalized abdominal pain: Secondary | ICD-10-CM

## 2016-03-07 LAB — BASIC METABOLIC PANEL
Anion gap: 7 (ref 5–15)
BUN: 9 mg/dL (ref 6–20)
CHLORIDE: 107 mmol/L (ref 101–111)
CO2: 26 mmol/L (ref 22–32)
CREATININE: 0.96 mg/dL (ref 0.61–1.24)
Calcium: 8.4 mg/dL — ABNORMAL LOW (ref 8.9–10.3)
GFR calc Af Amer: >60 mL/min (ref >60)
GFR calc non Af Amer: >60 mL/min (ref >60)
GLUCOSE: 85 mg/dL (ref 65–99)
POTASSIUM: 3.2 mmol/L — AB (ref 3.5–5.1)
SODIUM: 140 mmol/L (ref 135–145)

## 2016-03-07 LAB — CBC
HCT: 36.3 % — ABNORMAL LOW (ref 39.0–52.0)
HEMOGLOBIN: 12.4 g/dL — AB (ref 13.0–17.0)
MCH: 30.4 pg (ref 26.0–34.0)
MCHC: 34.2 g/dL (ref 30.0–36.0)
MCV: 89 fL (ref 78.0–100.0)
PLATELETS: 177 10*3/uL (ref 150–400)
RBC: 4.08 MIL/uL — ABNORMAL LOW (ref 4.22–5.81)
RDW: 13.5 % (ref 11.5–15.5)
WBC: 8.9 10*3/uL (ref 4.0–10.5)

## 2016-03-07 MED ORDER — MAGNESIUM CITRATE PO SOLN: 1.0000 | Freq: Once | ORAL | Status: DC

## 2016-03-07 MED ORDER — METRONIDAZOLE 500 MG PO TABS: 500.0000 mg | ORAL_TABLET | Freq: Three times a day (TID) | ORAL | Status: DC

## 2016-03-07 MED ORDER — CIPROFLOXACIN HCL 250 MG PO TABS: 250.0000 mg | ORAL_TABLET | Freq: Two times a day (BID) | ORAL | Status: DC

## 2016-03-07 NOTE — Progress Notes (Signed)
Patient discharged home with wife.  IV removed - WNL.  Reviewed DC instructions and medications.  Patient and wife verbalize understanding.  No questions at this time.  Assisted off unit ambulatory in NAD by RN.
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
## 2017-04-05 ENCOUNTER — Emergency Department (HOSPITAL_COMMUNITY)
Admission: EM | Admit: 2017-04-05 | Discharge: 2017-04-06 | Disposition: A | Discharge status: Home / Self Care | Payer: Non-veteran care | Attending: Emergency Medicine | Admitting: Emergency Medicine

## 2017-04-05 ENCOUNTER — Emergency Department (HOSPITAL_COMMUNITY): Payer: Non-veteran care

## 2017-04-05 ENCOUNTER — Encounter (HOSPITAL_COMMUNITY): Payer: Self-pay

## 2017-04-05 DIAGNOSIS — R109 Unspecified abdominal pain: Secondary | ICD-10-CM | POA: Insufficient documentation

## 2017-04-05 DIAGNOSIS — R55 Syncope and collapse: Secondary | ICD-10-CM | POA: Diagnosis not present

## 2017-04-05 DIAGNOSIS — Z7982 Long term (current) use of aspirin: Secondary | ICD-10-CM | POA: Diagnosis not present

## 2017-04-05 DIAGNOSIS — Z79899 Other long term (current) drug therapy: Secondary | ICD-10-CM | POA: Diagnosis not present

## 2017-04-05 DIAGNOSIS — R569 Unspecified convulsions: Secondary | ICD-10-CM | POA: Diagnosis present

## 2017-04-05 HISTORY — DX: Unspecified convulsions: R56.9

## 2017-04-05 LAB — COMPREHENSIVE METABOLIC PANEL
ALBUMIN: 3.2 g/dL — AB (ref 3.5–5.0)
ALT: 9 U/L — ABNORMAL LOW (ref 17–63)
ANION GAP: 8 (ref 5–15)
AST: 16 U/L (ref 15–41)
Alkaline Phosphatase: 59 U/L (ref 38–126)
BILIRUBIN TOTAL: 0.4 mg/dL (ref 0.3–1.2)
BUN: 10 mg/dL (ref 6–20)
CO2: 24 mmol/L (ref 22–32)
Calcium: 9 mg/dL (ref 8.9–10.3)
Chloride: 105 mmol/L (ref 101–111)
Creatinine, Ser: 1.07 mg/dL (ref 0.61–1.24)
GFR calc Af Amer: >60 mL/min (ref >60)
GFR calc non Af Amer: >60 mL/min (ref >60)
GLUCOSE: 115 mg/dL — AB (ref 65–99)
POTASSIUM: 4.2 mmol/L (ref 3.5–5.1)
SODIUM: 137 mmol/L (ref 135–145)
TOTAL PROTEIN: 6 g/dL — AB (ref 6.5–8.1)

## 2017-04-05 LAB — CBG MONITORING, ED: GLUCOSE-CAPILLARY: 112 mg/dL — AB (ref 65–99)

## 2017-04-05 LAB — CBC WITH DIFFERENTIAL/PLATELET
BASOS ABS: 0 10*3/uL (ref 0.0–0.1)
Basophils Relative: 0 %
EOS ABS: 0 10*3/uL (ref 0.0–0.7)
EOS PCT: 0 %
HCT: 38.1 % — ABNORMAL LOW (ref 39.0–52.0)
Hemoglobin: 12.4 g/dL — ABNORMAL LOW (ref 13.0–17.0)
Lymphocytes Relative: 10 %
Lymphs Abs: 1.6 10*3/uL (ref 0.7–4.0)
MCH: 28.9 pg (ref 26.0–34.0)
MCHC: 32.5 g/dL (ref 30.0–36.0)
MCV: 88.8 fL (ref 78.0–100.0)
MONO ABS: 0.8 10*3/uL (ref 0.1–1.0)
Monocytes Relative: 5 %
Neutro Abs: 13.2 10*3/uL — ABNORMAL HIGH (ref 1.7–7.7)
Neutrophils Relative %: 85 %
PLATELETS: 239 10*3/uL (ref 150–400)
RBC: 4.29 MIL/uL (ref 4.22–5.81)
RDW: 13.9 % (ref 11.5–15.5)
WBC: 15.7 10*3/uL — AB (ref 4.0–10.5)

## 2017-04-05 LAB — LIPASE, BLOOD: Lipase: 24 U/L (ref 11–51)

## 2017-04-05 MED ORDER — LIDOCAINE-EPINEPHRINE 1 %-1:100000 IJ SOLN
20.0000 mL | Freq: Once | INTRAMUSCULAR | Status: DC
  Filled 2017-04-05: qty 20

## 2017-04-05 MED ORDER — HYDROMORPHONE HCL 1 MG/ML IJ SOLN
1.0000 mg | Freq: Once | INTRAMUSCULAR | Status: AC
  Administered 2017-04-06: 1 mg via INTRAVENOUS
  Filled 2017-04-05: qty 1

## 2017-04-05 MED ORDER — VALPROATE SODIUM 500 MG/5ML IV SOLN
1000.0000 mg | INTRAVENOUS | Status: AC
  Administered 2017-04-06: 1000 mg via INTRAVENOUS
  Filled 2017-04-05: qty 10

## 2017-04-05 MED ORDER — LORAZEPAM 2 MG/ML IJ SOLN
1.0000 mg | Freq: Once | INTRAMUSCULAR | Status: AC
Start: 2017-04-06 — End: 2017-04-06
  Administered 2017-04-06: 1 mg via INTRAVENOUS

## 2017-04-05 MED ORDER — LORAZEPAM 2 MG/ML IJ SOLN
1.0000 mg | Freq: Once | INTRAMUSCULAR | Status: AC
  Administered 2017-04-05: 1 mg via INTRAVENOUS
  Filled 2017-04-05: qty 1

## 2017-04-05 MED ORDER — ONDANSETRON HCL 4 MG/2ML IJ SOLN
4.0000 mg | Freq: Once | INTRAMUSCULAR | Status: AC
  Administered 2017-04-06: 4 mg via INTRAVENOUS
  Filled 2017-04-05: qty 2

## 2017-04-05 MED ORDER — SODIUM CHLORIDE 0.9 % IV BOLUS (SEPSIS)
1000.0000 mL | Freq: Once | INTRAVENOUS | Status: AC
  Administered 2017-04-06: 1000 mL via INTRAVENOUS

## 2017-04-05 MED ORDER — DEXTROSE 5 % IV SOLN: 100.0000 mg | Freq: Once | INTRAVENOUS | Status: DC

## 2017-04-05 NOTE — ED Notes (Signed)
Pt's CBG result was 112. Informed Koleen NimrodAdrian - RN.
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
## 2017-04-06 DIAGNOSIS — R109 Unspecified abdominal pain: Secondary | ICD-10-CM | POA: Diagnosis not present

## 2017-04-06 DIAGNOSIS — Z7982 Long term (current) use of aspirin: Secondary | ICD-10-CM | POA: Diagnosis not present

## 2017-04-06 DIAGNOSIS — Z79899 Other long term (current) drug therapy: Secondary | ICD-10-CM | POA: Diagnosis not present

## 2017-04-06 DIAGNOSIS — R55 Syncope and collapse: Secondary | ICD-10-CM | POA: Diagnosis not present

## 2017-04-06 DIAGNOSIS — R569 Unspecified convulsions: Secondary | ICD-10-CM | POA: Diagnosis present

## 2017-04-06 MED ORDER — DIVALPROEX SODIUM 250 MG PO DR TAB: 250.0000 mg | DELAYED_RELEASE_TABLET | Freq: Three times a day (TID) | ORAL | 0 refills | Status: DC

## 2017-04-06 NOTE — ED Provider Notes (Signed)
Patient signed out to me to follow-up on status after Depakote. Patient had multiple seizures earlier today, has a known seizure disorder. Patient was administered Ativan followed by Depakote. He had a prolonged period of observation here in the ER without any further seizure activity. He will be discharged on Depakote, follow-up with neurology.   Gilda CreasePollina, Christopher J, MD 04/06/17 [phone]

## 2017-04-06 NOTE — ED Notes (Signed)
PT states understanding of care given, follow up care, and medication prescribed. PT ambulated from ED to car with a steady gait. 

## 2018-03-13 IMAGING — CT CT HEAD W/O CM
4 series · 17 of 47 positions shown, 19 images · non-contrast
Comparison: None.

CLINICAL DATA: Seizure and migraines

EXAM:
CT HEAD WITHOUT CONTRAST
TECHNIQUE: Contiguous axial images were obtained from the base of the skull
through the vertex without intravenous contrast.

[Series 3: head wo · axial · 0.39mm/px · z∈[+100,+220]mm · 7 of 34 slices shown, 9 images]
[im 5/34  brain]
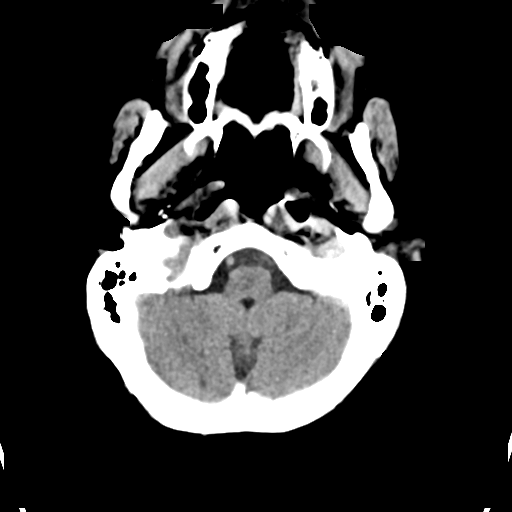
[im 5/34  bone]
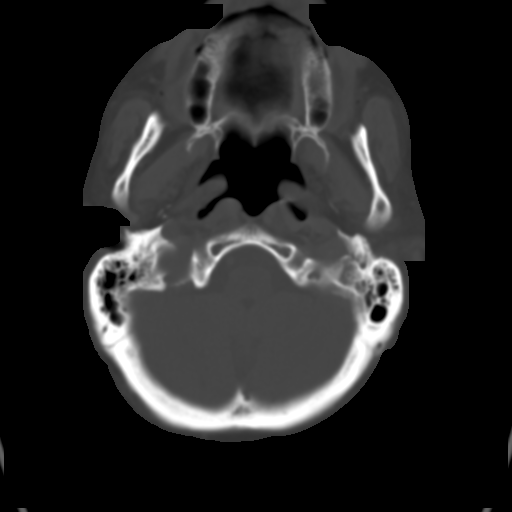
[im 9/34  brain]
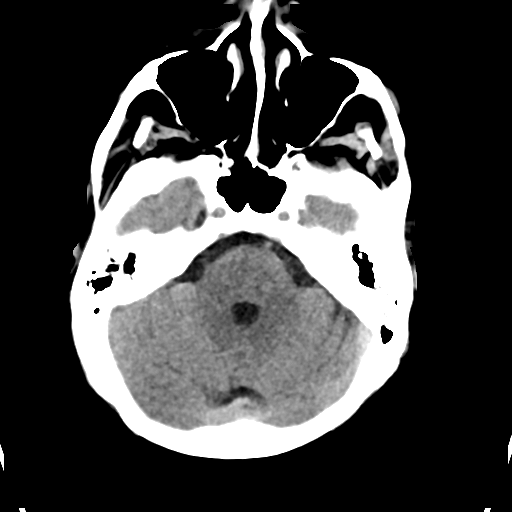
[im 13/34  brain]
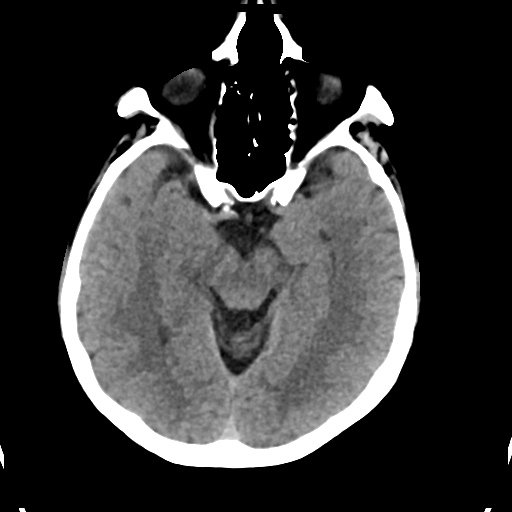
[im 17/34  brain]
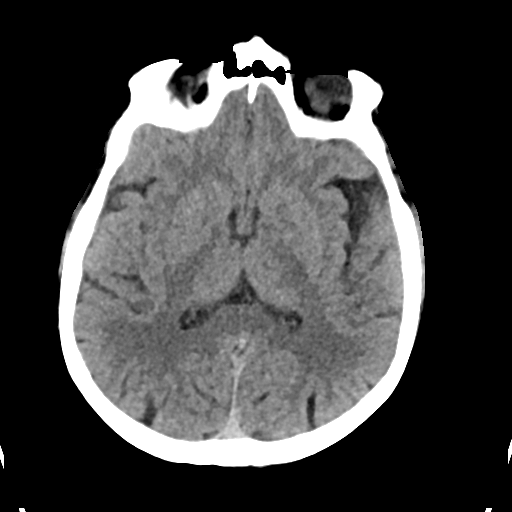
[im 21/34  brain]
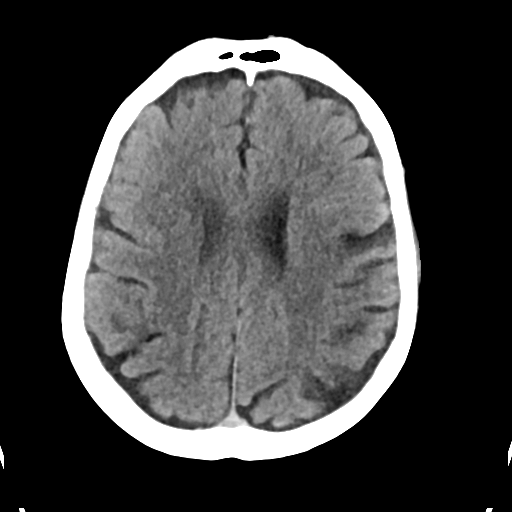
[im 21/34  bone]
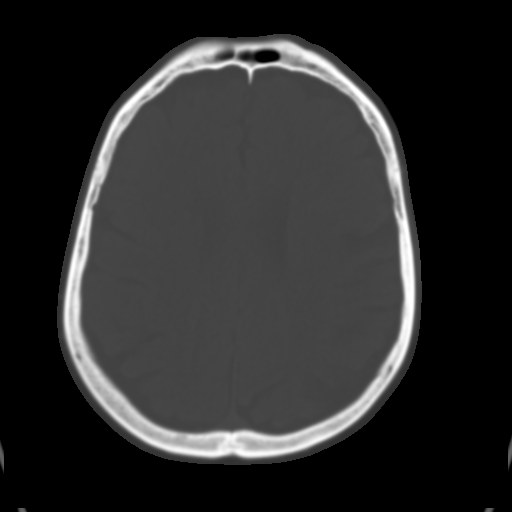
[im 25/34  brain]
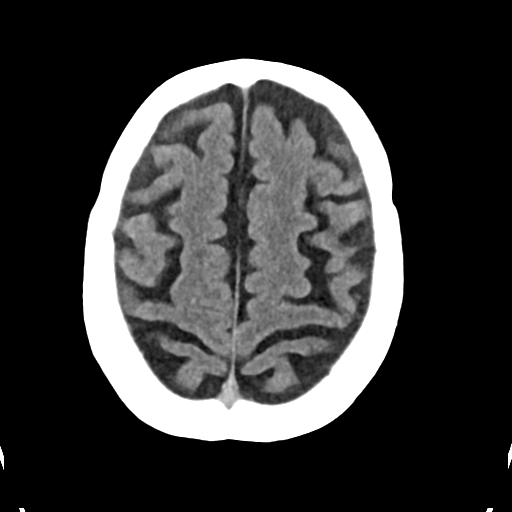
[im 29/34  brain]
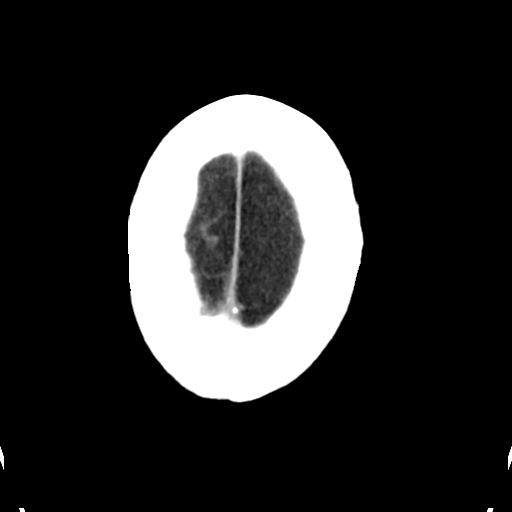

[Series 4: head bone · axial · 0.39mm/px · z∈[+96,+154]mm · 4 of 84 slices shown]
[im 9/84  bone]
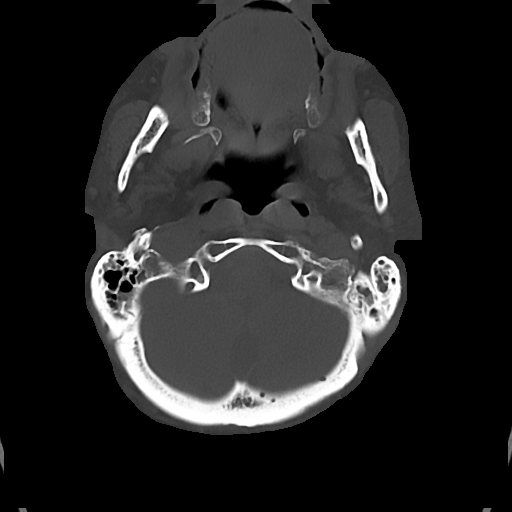
[im 17/84  bone]
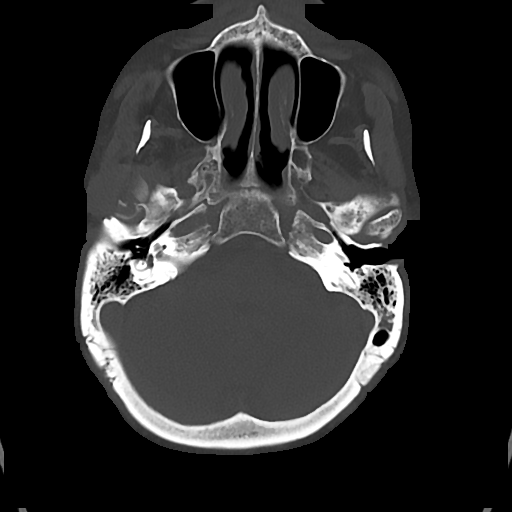
[im 25/84  bone]
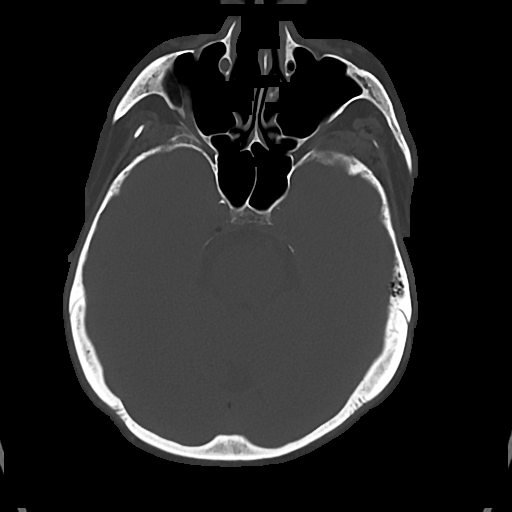
[im 38/84  bone]
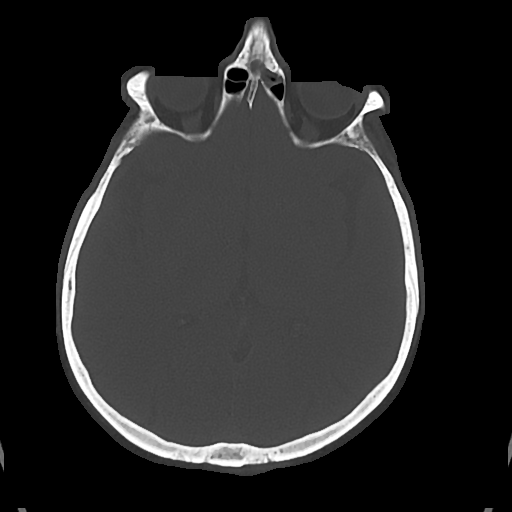

[Series 5: cor soft · coronal · 0.32mm/px · 3 of 66 slices shown]
[im 22/66  brain]
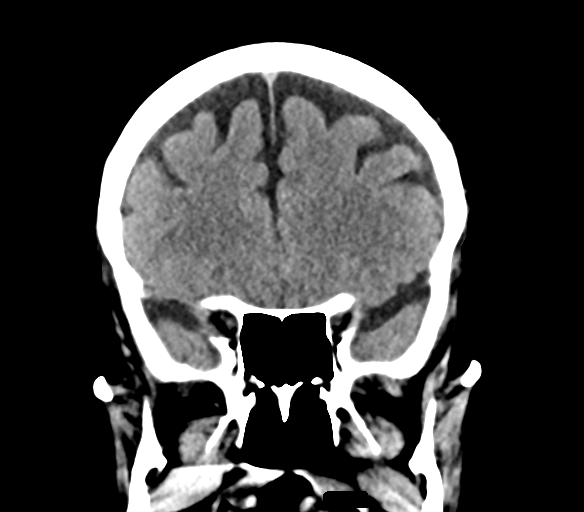
[im 29/66  brain]
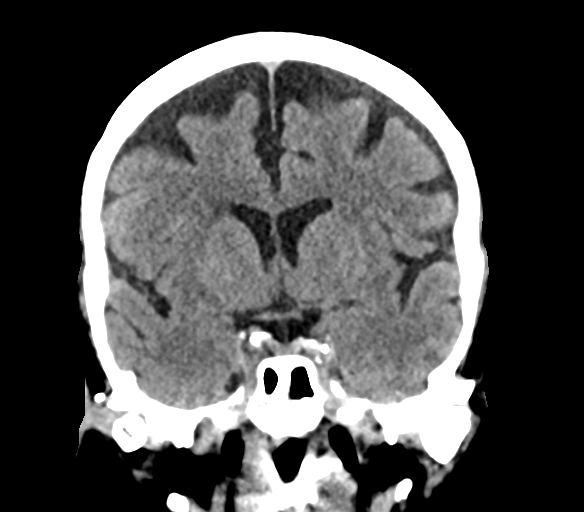
[im 37/66  brain]
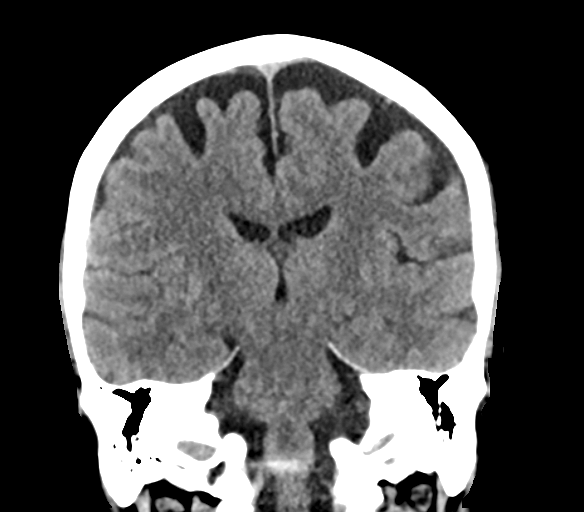

[Series 6: sag soft · sagittal · 0.34mm/px · 3 of 59 slices shown]
[im 20/59  brain]
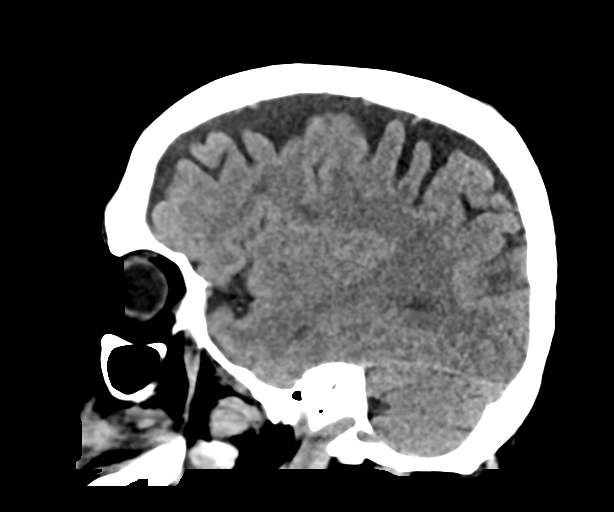
[im 30/59  brain]
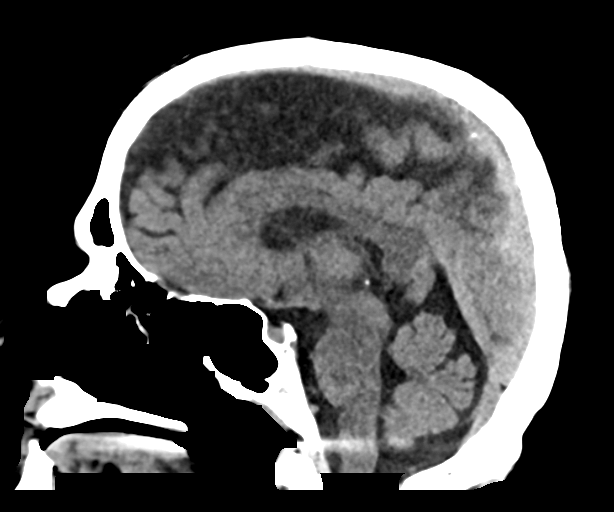
[im 39/59  brain]
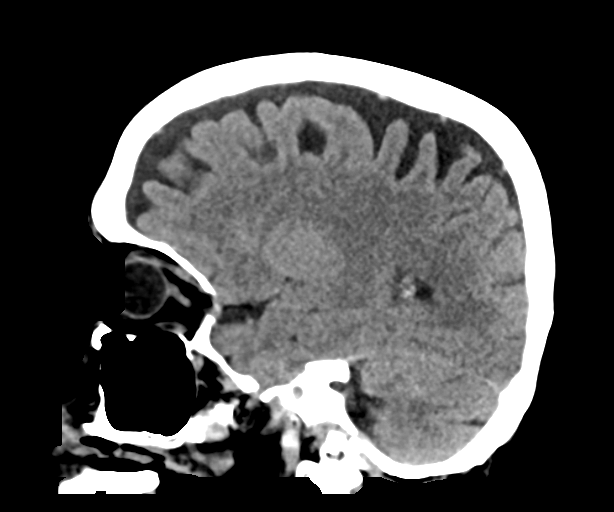

[17 of 47 positions shown; findings below may reference images not displayed]

FINDINGS: Brain: No mass lesion, intraparenchymal hemorrhage or extra-axial
collection. No evidence of acute cortical infarct. Brain parenchyma
and CSF-containing spaces are normal for age. Prominent perivascular
space of the right lentiform nucleus.

Vascular: No hyperdense vessel or unexpected calcification.

Skull: Normal visualized skull base, calvarium and extracranial soft
tissues.

Sinuses/Orbits: No sinus fluid levels or advanced mucosal
thickening. Mild left mastoid opacification. Normal orbits.
IMPRESSION: No acute intracranial abnormality. Normal appearance of the brain
for age.

## 2018-10-24 ENCOUNTER — Observation Stay (HOSPITAL_COMMUNITY)
Admission: EM | Admit: 2018-10-24 | Discharge: 2018-10-26 | Disposition: A | Discharge status: Home / Self Care | Payer: No Typology Code available for payment source | Attending: Internal Medicine | Admitting: Internal Medicine

## 2018-10-24 ENCOUNTER — Emergency Department (HOSPITAL_COMMUNITY): Payer: No Typology Code available for payment source

## 2018-10-24 ENCOUNTER — Encounter (HOSPITAL_COMMUNITY): Payer: Self-pay

## 2018-10-24 DIAGNOSIS — N179 Acute kidney failure, unspecified: Secondary | ICD-10-CM | POA: Diagnosis not present

## 2018-10-24 DIAGNOSIS — G8929 Other chronic pain: Secondary | ICD-10-CM | POA: Insufficient documentation

## 2018-10-24 DIAGNOSIS — F4310 Post-traumatic stress disorder, unspecified: Secondary | ICD-10-CM | POA: Insufficient documentation

## 2018-10-24 DIAGNOSIS — K219 Gastro-esophageal reflux disease without esophagitis: Secondary | ICD-10-CM | POA: Diagnosis present

## 2018-10-24 DIAGNOSIS — R109 Unspecified abdominal pain: Secondary | ICD-10-CM | POA: Diagnosis present

## 2018-10-24 DIAGNOSIS — D72829 Elevated white blood cell count, unspecified: Secondary | ICD-10-CM | POA: Insufficient documentation

## 2018-10-24 DIAGNOSIS — R569 Unspecified convulsions: Secondary | ICD-10-CM | POA: Diagnosis not present

## 2018-10-24 DIAGNOSIS — Z933 Colostomy status: Secondary | ICD-10-CM | POA: Diagnosis not present

## 2018-10-24 DIAGNOSIS — Z79899 Other long term (current) drug therapy: Secondary | ICD-10-CM | POA: Insufficient documentation

## 2018-10-24 DIAGNOSIS — N400 Enlarged prostate without lower urinary tract symptoms: Secondary | ICD-10-CM | POA: Diagnosis not present

## 2018-10-24 DIAGNOSIS — R1084 Generalized abdominal pain: Secondary | ICD-10-CM | POA: Diagnosis not present

## 2018-10-24 DIAGNOSIS — Z9049 Acquired absence of other specified parts of digestive tract: Secondary | ICD-10-CM | POA: Insufficient documentation

## 2018-10-24 DIAGNOSIS — F32A Depression, unspecified: Secondary | ICD-10-CM | POA: Diagnosis present

## 2018-10-24 DIAGNOSIS — F329 Major depressive disorder, single episode, unspecified: Secondary | ICD-10-CM | POA: Diagnosis not present

## 2018-10-24 DIAGNOSIS — K5900 Constipation, unspecified: Secondary | ICD-10-CM | POA: Insufficient documentation

## 2018-10-24 DIAGNOSIS — Z7982 Long term (current) use of aspirin: Secondary | ICD-10-CM | POA: Diagnosis not present

## 2018-10-24 DIAGNOSIS — R9431 Abnormal electrocardiogram [ECG] [EKG]: Secondary | ICD-10-CM | POA: Diagnosis not present

## 2018-10-24 DIAGNOSIS — R52 Pain, unspecified: Secondary | ICD-10-CM

## 2018-10-24 DIAGNOSIS — K5903 Drug induced constipation: Secondary | ICD-10-CM | POA: Diagnosis present

## 2018-10-24 HISTORY — DX: Drug induced constipation: K59.03

## 2018-10-24 LAB — COMPREHENSIVE METABOLIC PANEL
ALBUMIN: 3.6 g/dL (ref 3.5–5.0)
ALT: 18 U/L (ref 0–44)
ANION GAP: 9 (ref 5–15)
AST: 21 U/L (ref 15–41)
Alkaline Phosphatase: 55 U/L (ref 38–126)
BILIRUBIN TOTAL: 0.4 mg/dL (ref 0.3–1.2)
BUN: 18 mg/dL (ref 8–23)
CO2: 28 mmol/L (ref 22–32)
Calcium: 9.2 mg/dL (ref 8.9–10.3)
Chloride: 103 mmol/L (ref 98–111)
Creatinine, Ser: 1.4 mg/dL — ABNORMAL HIGH (ref 0.61–1.24)
GFR, EST AFRICAN AMERICAN: 59 mL/min — AB (ref >60)
GFR, EST NON AFRICAN AMERICAN: 51 mL/min — AB (ref >60)
Glucose, Bld: 96 mg/dL (ref 70–99)
Potassium: 4.2 mmol/L (ref 3.5–5.1)
Sodium: 140 mmol/L (ref 135–145)
TOTAL PROTEIN: 6.7 g/dL (ref 6.5–8.1)

## 2018-10-24 LAB — LIPASE, BLOOD: Lipase: 19 U/L (ref 11–51)

## 2018-10-24 LAB — CBC
HCT: 39.3 % (ref 39.0–52.0)
Hemoglobin: 12.3 g/dL — ABNORMAL LOW (ref 13.0–17.0)
MCH: 29.9 pg (ref 26.0–34.0)
MCHC: 31.3 g/dL (ref 30.0–36.0)
MCV: 95.4 fL (ref 80.0–100.0)
NRBC: 0 % (ref 0.0–0.2)
Platelets: 150 10*3/uL (ref 150–400)
RBC: 4.12 MIL/uL — AB (ref 4.22–5.81)
RDW: 13.9 % (ref 11.5–15.5)
WBC: 11.1 10*3/uL — AB (ref 4.0–10.5)

## 2018-10-24 MED ORDER — SODIUM CHLORIDE 0.9% FLUSH: 3.0000 mL | Freq: Once | INTRAVENOUS | Status: DC

## 2018-10-24 MED ORDER — HYDROMORPHONE HCL 1 MG/ML IJ SOLN
0.5000 mg | Freq: Once | INTRAMUSCULAR | Status: AC
  Administered 2018-10-24: 0.5 mg via INTRAVENOUS
  Filled 2018-10-24: qty 1

## 2018-10-24 MED ORDER — HYDROMORPHONE HCL 1 MG/ML IJ SOLN
1.0000 mg | Freq: Once | INTRAMUSCULAR | Status: AC
  Administered 2018-10-24: 1 mg via INTRAVENOUS
  Filled 2018-10-24: qty 1

## 2018-10-24 MED ORDER — LORAZEPAM 2 MG/ML IJ SOLN: 1.0000 mg | INTRAMUSCULAR | Status: DC | PRN

## 2018-10-24 MED ORDER — MILK AND MOLASSES ENEMA
1.0000 | Freq: Once | RECTAL | Status: AC
  Administered 2018-10-24: 250 mL via RECTAL
  Filled 2018-10-24: qty 250

## 2018-10-24 MED ORDER — IOHEXOL 300 MG/ML  SOLN
100.0000 mL | Freq: Once | INTRAMUSCULAR | Status: AC | PRN
  Administered 2018-10-24: 100 mL via INTRAVENOUS
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
## 2018-10-24 NOTE — ED Triage Notes (Signed)
Pt presents for evaluation of abd pain x 4 days with constipation. Hx of bowel blockages. No N/V.

## 2018-10-25 ENCOUNTER — Encounter (HOSPITAL_COMMUNITY): Payer: Self-pay | Admitting: Internal Medicine

## 2018-10-25 ENCOUNTER — Observation Stay (HOSPITAL_COMMUNITY): Payer: No Typology Code available for payment source

## 2018-10-25 DIAGNOSIS — K5903 Drug induced constipation: Secondary | ICD-10-CM | POA: Diagnosis not present

## 2018-10-25 DIAGNOSIS — F4310 Post-traumatic stress disorder, unspecified: Secondary | ICD-10-CM | POA: Diagnosis not present

## 2018-10-25 DIAGNOSIS — R1084 Generalized abdominal pain: Secondary | ICD-10-CM | POA: Diagnosis not present

## 2018-10-25 DIAGNOSIS — R109 Unspecified abdominal pain: Principal | ICD-10-CM

## 2018-10-25 DIAGNOSIS — G8929 Other chronic pain: Secondary | ICD-10-CM

## 2018-10-25 LAB — BASIC METABOLIC PANEL
ANION GAP: 7 (ref 5–15)
BUN: 22 mg/dL (ref 8–23)
CO2: 29 mmol/L (ref 22–32)
Calcium: 8.8 mg/dL — ABNORMAL LOW (ref 8.9–10.3)
Chloride: 105 mmol/L (ref 98–111)
Creatinine, Ser: 1.2 mg/dL (ref 0.61–1.24)
GFR calc Af Amer: >60 mL/min (ref >60)
GFR calc non Af Amer: >60 mL/min (ref >60)
Glucose, Bld: 90 mg/dL (ref 70–99)
Potassium: 4.1 mmol/L (ref 3.5–5.1)
Sodium: 141 mmol/L (ref 135–145)

## 2018-10-25 LAB — CBC
HCT: 35.3 % — ABNORMAL LOW (ref 39.0–52.0)
HEMOGLOBIN: 11.5 g/dL — AB (ref 13.0–17.0)
MCH: 30.4 pg (ref 26.0–34.0)
MCHC: 32.6 g/dL (ref 30.0–36.0)
MCV: 93.4 fL (ref 80.0–100.0)
NRBC: 0 % (ref 0.0–0.2)
Platelets: 150 10*3/uL (ref 150–400)
RBC: 3.78 MIL/uL — AB (ref 4.22–5.81)
RDW: 13.8 % (ref 11.5–15.5)
WBC: 10.9 10*3/uL — ABNORMAL HIGH (ref 4.0–10.5)

## 2018-10-25 MED ORDER — TRAZODONE HCL 50 MG PO TABS
50.0000 mg | ORAL_TABLET | Freq: Every day | ORAL | Status: DC
  Administered 2018-10-25 (×2): 50 mg via ORAL
  Filled 2018-10-25 (×2): qty 1

## 2018-10-25 MED ORDER — ONDANSETRON HCL 4 MG/2ML IJ SOLN
4.0000 mg | Freq: Four times a day (QID) | INTRAMUSCULAR | Status: DC | PRN
  Administered 2018-10-25: 4 mg via INTRAVENOUS
  Filled 2018-10-25: qty 2

## 2018-10-25 MED ORDER — POLYETHYLENE GLYCOL 3350 17 G PO PACK
17.0000 g | PACK | Freq: Two times a day (BID) | ORAL | Status: DC
  Administered 2018-10-25 (×3): 17 g via ORAL
  Filled 2018-10-25 (×4): qty 1

## 2018-10-25 MED ORDER — GABAPENTIN 300 MG PO CAPS
900.0000 mg | ORAL_CAPSULE | Freq: Every day | ORAL | Status: DC
  Administered 2018-10-25 (×2): 900 mg via ORAL
  Filled 2018-10-25 (×2): qty 3

## 2018-10-25 MED ORDER — LACTULOSE 10 GM/15ML PO SOLN
20.0000 g | Freq: Three times a day (TID) | ORAL | Status: DC
  Administered 2018-10-25 (×2): 20 g via ORAL
  Filled 2018-10-25 (×2): qty 30

## 2018-10-25 MED ORDER — ASPIRIN EC 81 MG PO TBEC
81.0000 mg | DELAYED_RELEASE_TABLET | Freq: Every day | ORAL | Status: DC
  Administered 2018-10-25 (×2): 81 mg via ORAL
  Filled 2018-10-25 (×2): qty 1

## 2018-10-25 MED ORDER — LACTULOSE 10 GM/15ML PO SOLN
30.0000 g | Freq: Three times a day (TID) | ORAL | Status: DC
  Administered 2018-10-25 (×2): 30 g via ORAL
  Filled 2018-10-25 (×2): qty 45

## 2018-10-25 MED ORDER — PANTOPRAZOLE SODIUM 40 MG PO TBEC
40.0000 mg | DELAYED_RELEASE_TABLET | Freq: Every day | ORAL | Status: DC
  Administered 2018-10-25 – 2018-10-26 (×2): 40 mg via ORAL
  Filled 2018-10-25 (×2): qty 1

## 2018-10-25 MED ORDER — SENNOSIDES-DOCUSATE SODIUM 8.6-50 MG PO TABS
2.0000 | ORAL_TABLET | Freq: Two times a day (BID) | ORAL | Status: DC
  Administered 2018-10-25 (×3): 2 via ORAL
  Filled 2018-10-25 (×4): qty 2

## 2018-10-25 MED ORDER — VITAMIN B-12 1000 MCG PO TABS
1000.0000 ug | ORAL_TABLET | Freq: Every day | ORAL | Status: DC
  Administered 2018-10-25 – 2018-10-26 (×2): 1000 ug via ORAL
  Filled 2018-10-25 (×2): qty 1

## 2018-10-25 MED ORDER — VITAMIN D 25 MCG (1000 UNIT) PO TABS
1000.0000 [IU] | ORAL_TABLET | Freq: Every day | ORAL | Status: DC
  Administered 2018-10-25 – 2018-10-26 (×2): 1000 [IU] via ORAL
  Filled 2018-10-25 (×2): qty 1

## 2018-10-25 MED ORDER — LORAZEPAM 2 MG/ML IJ SOLN: 0.5000 mg | Freq: Four times a day (QID) | INTRAMUSCULAR | Status: DC | PRN

## 2018-10-25 MED ORDER — ACETAMINOPHEN 325 MG PO TABS
650.0000 mg | ORAL_TABLET | Freq: Four times a day (QID) | ORAL | Status: DC | PRN
  Administered 2018-10-25 (×2): 650 mg via ORAL
  Filled 2018-10-25 (×2): qty 2

## 2018-10-25 MED ORDER — DIVALPROEX SODIUM ER 500 MG PO TB24
1000.0000 mg | ORAL_TABLET | Freq: Every day | ORAL | Status: DC
  Administered 2018-10-25 – 2018-10-26 (×2): 1000 mg via ORAL
  Filled 2018-10-25 (×2): qty 2

## 2018-10-25 MED ORDER — SORBITOL 70 % SOLN
960.0000 mL | TOPICAL_OIL | Freq: Once | ORAL | Status: AC
  Administered 2018-10-25: 960 mL via RECTAL
  Filled 2018-10-25: qty 473

## 2018-10-25 MED ORDER — HEPARIN SODIUM (PORCINE) 5000 UNIT/ML IJ SOLN
5000.0000 [IU] | Freq: Three times a day (TID) | INTRAMUSCULAR | Status: DC
  Administered 2018-10-25 – 2018-10-26 (×4): 5000 [IU] via SUBCUTANEOUS
  Filled 2018-10-25 (×3): qty 1

## 2018-10-25 MED ORDER — MAGNESIUM CITRATE PO SOLN
1.0000 | Freq: Once | ORAL | Status: AC
  Administered 2018-10-25: 1 via ORAL
  Filled 2018-10-25: qty 296

## 2018-10-25 MED ORDER — SODIUM CHLORIDE 0.9 % IV SOLN
INTRAVENOUS | Status: DC
  Administered 2018-10-25: 11:00:00 via INTRAVENOUS

## 2018-10-25 MED ORDER — OXYCODONE HCL 5 MG PO TABS
5.0000 mg | ORAL_TABLET | Freq: Four times a day (QID) | ORAL | Status: DC | PRN
  Administered 2018-10-25 (×3): 5 mg via ORAL
  Filled 2018-10-25 (×3): qty 1

## 2018-10-25 MED ORDER — METHADONE HCL 10 MG PO TABS
5.0000 mg | ORAL_TABLET | Freq: Three times a day (TID) | ORAL | Status: DC
  Administered 2018-10-25 – 2018-10-26 (×5): 5 mg via ORAL
  Filled 2018-10-25 (×5): qty 1

## 2018-10-25 MED ORDER — ONDANSETRON HCL 4 MG PO TABS: 4.0000 mg | ORAL_TABLET | Freq: Four times a day (QID) | ORAL | Status: DC | PRN

## 2018-10-25 MED ORDER — FINASTERIDE 5 MG PO TABS
5.0000 mg | ORAL_TABLET | Freq: Every day | ORAL | Status: DC
  Administered 2018-10-25 – 2018-10-26 (×2): 5 mg via ORAL
  Filled 2018-10-25 (×2): qty 1

## 2018-10-25 MED ORDER — LORAZEPAM 2 MG/ML IJ SOLN: 1.0000 mg | INTRAMUSCULAR | Status: DC | PRN

## 2018-10-25 MED ORDER — LORAZEPAM 0.5 MG PO TABS
0.5000 mg | ORAL_TABLET | Freq: Four times a day (QID) | ORAL | Status: DC | PRN
  Administered 2018-10-25: 0.5 mg via ORAL
  Filled 2018-10-25: qty 1

## 2018-10-25 MED ORDER — LORAZEPAM 2 MG/ML IJ SOLN: 0.5000 mg | INTRAMUSCULAR | Status: DC | PRN

## 2018-10-25 MED ORDER — HYDROMORPHONE HCL 1 MG/ML IJ SOLN
0.5000 mg | INTRAMUSCULAR | Status: DC | PRN
  Administered 2018-10-25 (×2): 0.5 mg via INTRAVENOUS
  Filled 2018-10-25 (×2): qty 1

## 2018-10-25 MED ORDER — ACETAMINOPHEN 650 MG RE SUPP: 650.0000 mg | Freq: Four times a day (QID) | RECTAL | Status: DC | PRN

## 2018-10-25 MED ORDER — TAMSULOSIN HCL 0.4 MG PO CAPS
0.8000 mg | ORAL_CAPSULE | Freq: Every day | ORAL | Status: DC
  Administered 2018-10-25 (×2): 0.8 mg via ORAL
  Filled 2018-10-25 (×2): qty 2

## 2018-10-25 MED ORDER — SODIUM CHLORIDE 0.9 % IV SOLN
INTRAVENOUS | Status: DC
  Administered 2018-10-25 (×2): via INTRAVENOUS

## 2018-10-25 MED ORDER — AMITRIPTYLINE HCL 50 MG PO TABS
150.0000 mg | ORAL_TABLET | Freq: Every day | ORAL | Status: DC
  Administered 2018-10-25 (×2): 150 mg via ORAL
  Filled 2018-10-25: qty 6
  Filled 2018-10-25: qty 3

## 2018-10-25 NOTE — Progress Notes (Addendum)
[facility] PROGRESS NOTE  Jonathon Turner TDS:[id number] DOB: 27-Apr-1951 DOA: 10/24/2018 PCP: System, Pcp Not In  Assessment/Plan:  Abdominal pain and constipation: pt with long hx chronic abdominal pain and multiple abdominal surgeries including shrapnel removal and hernia repair and subsequent infected mesh removal, hx bowel resection with colostomy and colostomy breakdown presents with abdominal pain.  CT scan showed large stool burden, but no obvious obstruction. Home meds include methadone, oxycodone. Continues to complain of abdominal pain. Abdomen soft +BS. Reports no results from enema yesterday. Only mild leukocytosis. afebrile -will provide mag citrate now -mobilize patient -stop IV diluadid -provide ativan prn -continue  MiraLAX, Senokot, lactulose -Continue home methadone and oxycodone -IV fluid: Normal saline 75 cc/h -if getting worse, may repeat CT scan of abdomen/pelvis to rule out obstruction.  BPH: stable - Continue Flomax and Proscar  Depression:  -Amitriptyline  GERD: -Protonix  Seizure -Seizure precaution -When necessary Ativan for seizure -Continue Home medications: Depakote -Patient is also on gabapentin  AKI (acute kidney injury) (HCC): mild. Creatinine trending down this am.  -IVF as above -f/u by BMP   Code Status: full Family Communication: none present Disposition Plan: home hopefully tomorrow   Consultants:  none  Procedures:    Antibiotics:    HPI/Subjective: Complains abdominal pain. Reports no results from enema  69 yo hx PTSD, BPH, chronic abdominal pain on methadone admitted abdominal pain likely related to constipation.   Objective: Vitals:   10/25/18 0154 10/25/18 0525  BP: (!) 172/82 (!) 128/58  Pulse: 81 70  Resp: 17 17  Temp: 98.7 F (37.1 C) 98.3 F (36.8 C)  SpO2: 99% 100%    Intake/Output Summary (Last 24 hours) at 10/25/2018 1040 Last data filed at 10/25/2018 0525 Gross per 24 hour  Intake  174.83 ml  Output 150 ml  Net 24.83 ml   Filed Weights   10/25/18 0154  Weight: 54.4 kg    Exam:   General:  Chronically ill appearing awake alert no acute distress  Cardiovascular: rrr no mgr no LE edema  Respiratory: normal effort BS clear bilaterally no wheeze  Abdomen: distended but soft, healed abdominal incision +BS mild diffuse tenderness throughout no guarding  Musculoskeletal: joints without swelling/erythema   Data Reviewed: Basic Metabolic Panel: Recent Labs  Lab 10/24/18 1331 10/25/18 0210  NA 140 141  K 4.2 4.1  CL 103 105  CO2 28 29  GLUCOSE 96 90  BUN 18 22  CREATININE 1.40* 1.20  CALCIUM 9.2 8.8*   Liver Function Tests: Recent Labs  Lab 10/24/18 1331  AST 21  ALT 18  ALKPHOS 55  BILITOT 0.4  PROT 6.7  ALBUMIN 3.6   Recent Labs  Lab 10/24/18 1331  LIPASE 19   No results for input(s): AMMONIA in the last 168 hours. CBC: Recent Labs  Lab 10/24/18 1331 10/25/18 0210  WBC 11.1* 10.9*  HGB 12.3* 11.5*  HCT 39.3 35.3*  MCV 95.4 93.4  PLT 150 150   Cardiac Enzymes: No results for input(s): CKTOTAL, CKMB, CKMBINDEX, TROPONINI in the last 168 hours. BNP (last 3 results) No results for input(s): BNP in the last 8760 hours.  ProBNP (last 3 results) No results for input(s): PROBNP in the last 8760 hours.  CBG: No results for input(s): GLUCAP in the last 168 hours.  No results found for this or any previous visit (from the past 240 hour(s)).   Studies: Ct Abdomen Pelvis W Contrast  Result Date: 10/24/2018 CLINICAL DATA:  Abdominal pain, 4 days,  constipation, history of bowel obstruction EXAM: CT ABDOMEN AND PELVIS WITH CONTRAST TECHNIQUE: Multidetector CT imaging of the abdomen and pelvis was performed using the standard protocol following bolus administration of intravenous contrast. CONTRAST:  OMNIPAQUE IOHEXOL 300 MG/ML  SOLN COMPARISON:  03/05/2016 FINDINGS: Lower chest: And like scarring or atelectasis of the included  bilateral lung bases. Hepatobiliary: No focal liver abnormality is seen. No gallstones, gallbladder wall thickening, or biliary dilatation. Pancreas: Unremarkable. No pancreatic ductal dilatation or surrounding inflammatory changes. Spleen: Normal in size without focal abnormality. Adrenals/Urinary Tract: Adrenal glands are unremarkable. Kidneys are normal, without renal calculi, focal lesion, or hydronephrosis. Bladder is unremarkable. Stomach/Bowel: Stomach is within normal limits. The patient is status post prior partial right colon resection. The distal small bowel is fluid-filled although not overtly distended. There is a very large burden of stool in stool balls in the distal colon without evidence of proximal obstruction. Vascular/Lymphatic: Calcific atherosclerosis. No enlarged abdominal or pelvic lymph nodes. Reproductive: No mass or other abnormality. Other: No abdominal wall hernia or abnormality. No abdominopelvic ascites. Musculoskeletal: No acute or significant osseous findings. IMPRESSION: 1. The patient is status post prior partial right colon resection. The distal small bowel is fluid-filled although not overtly distended. There is a very large burden of stool in stool balls in the distal colon without evidence of proximal obstruction. Bowel transit may be evaluated by contrast follow-up if there is concern for obstruction. 2.  Chronic and incidental findings as detailed above. Electronically Signed   By: Lauralyn Primes M.D.   On: 10/24/2018 15:55    Scheduled Meds: . amitriptyline  150 mg Oral QHS  . aspirin EC  81 mg Oral QHS  . cholecalciferol  1,000 Units Oral Daily  . divalproex  1,000 mg Oral Daily  . finasteride  5 mg Oral Daily  . gabapentin  900 mg Oral QHS  . heparin  5,000 Units Subcutaneous Q8H  . lactulose  20 g Oral TID  . methadone  5 mg Oral TID  . pantoprazole  40 mg Oral Daily  . polyethylene glycol  17 g Oral BID  . senna-docusate  2 tablet Oral BID  . sodium  chloride flush  3 mL Intravenous Once  . tamsulosin  0.8 mg Oral QHS  . traZODone  50 mg Oral QHS  . vitamin B-12  1,000 mcg Oral Daily   Continuous Infusions: . sodium chloride 75 mL/hr at 10/25/18 1031    Principal Problem:   Abdominal pain Active Problems:   Chronic abdominal pain   AKI (acute kidney injury) (HCC)   Constipation due to pain medication   PTSD (post-traumatic stress disorder)   BPH (benign prostatic hyperplasia)   Depression   Seizure (HCC)   GERD (gastroesophageal reflux disease)    Time spent: 45 minutes    [facility] M  [facility]  If 7PM-7AM, please contact night-coverage at [email or web address], password [facility] 10/25/2018, 10:40 AM  LOS: 0 days

## 2018-10-26 DIAGNOSIS — R1084 Generalized abdominal pain: Secondary | ICD-10-CM

## 2018-10-26 DIAGNOSIS — K5903 Drug induced constipation: Secondary | ICD-10-CM | POA: Diagnosis not present

## 2018-10-26 LAB — BASIC METABOLIC PANEL
Anion gap: 12 (ref 5–15)
BUN: 12 mg/dL (ref 8–23)
CALCIUM: 8.5 mg/dL — AB (ref 8.9–10.3)
CO2: 25 mmol/L (ref 22–32)
Chloride: 104 mmol/L (ref 98–111)
Creatinine, Ser: 1.01 mg/dL (ref 0.61–1.24)
GFR calc Af Amer: >60 mL/min (ref >60)
GFR calc non Af Amer: >60 mL/min (ref >60)
Glucose, Bld: 89 mg/dL (ref 70–99)
Potassium: 4.3 mmol/L (ref 3.5–5.1)
Sodium: 141 mmol/L (ref 135–145)

## 2018-10-26 LAB — CBC
HEMATOCRIT: 32.7 % — AB (ref 39.0–52.0)
Hemoglobin: 10.6 g/dL — ABNORMAL LOW (ref 13.0–17.0)
MCH: 30.4 pg (ref 26.0–34.0)
MCHC: 32.4 g/dL (ref 30.0–36.0)
MCV: 93.7 fL (ref 80.0–100.0)
Platelets: 142 10*3/uL — ABNORMAL LOW (ref 150–400)
RBC: 3.49 MIL/uL — AB (ref 4.22–5.81)
RDW: 13.8 % (ref 11.5–15.5)
WBC: 6.9 10*3/uL (ref 4.0–10.5)
nRBC: 0 % (ref 0.0–0.2)

## 2018-10-26 LAB — HIV ANTIBODY (ROUTINE TESTING W REFLEX): HIV Screen 4th Generation wRfx: NONREACTIVE

## 2018-10-26 MED ORDER — MAGNESIUM CITRATE PO SOLN: 1.0000 | Freq: Once | ORAL | 2 refills | Status: AC | PRN

## 2018-10-26 MED ORDER — LACTULOSE 10 GM/15ML PO SOLN: 30.0000 g | Freq: Every day | ORAL | 0 refills | Status: AC | PRN

## 2018-10-26 MED ORDER — FLEET ENEMA 7-19 GM/118ML RE ENEM: 1.0000 | ENEMA | Freq: Every day | RECTAL | 0 refills | Status: AC | PRN

## 2018-10-26 MED ORDER — TAMSULOSIN HCL 0.4 MG PO CAPS: 0.8000 mg | ORAL_CAPSULE | Freq: Every day | ORAL | 0 refills | Status: AC

## 2018-10-26 MED ORDER — GLYCERIN (ADULT) 2 G RE SUPP: 1.0000 | RECTAL | 0 refills | Status: AC | PRN

## 2018-10-26 NOTE — Progress Notes (Signed)
Pt is discharged to go home.  Discharge instructions and prescriptions given to pt and wife.

## 2018-10-26 NOTE — Discharge Summary (Addendum)
Physician Discharge Summary  Jonathon Turner YNW:[id number] DOB: April 26, 1951 DOA: 10/24/2018  PCP: System, Pcp Not In  Admit date: 10/24/2018 Discharge date: 10/26/2018  Time spent: 45 minutes  Recommendations for Outpatient Follow-up:  1. Follow up with PCP 1-2 weeks for evaluation of symptoms and post hospital visit. Recommend cbc to track Hg. 2. Bowel regimen   Discharge Diagnoses:  Principal Problem:   Abdominal pain Active Problems:   Chronic abdominal pain   PTSD (post-traumatic stress disorder)   BPH (benign prostatic hyperplasia)   Depression   Seizure (HCC)   GERD (gastroesophageal reflux disease)   AKI (acute kidney injury) (HCC)   Constipation due to pain medication   Discharge Condition: stable  Diet recommendation: heart healthy  Filed Weights   10/25/18 0154  Weight: 54.4 kg    History of present illness:  pt with long hx chronic abdominal pain and multiple abdominal surgeries including shrapnel removal and hernia repair and subsequent infected mesh removal, 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 CT scan showed large stool burden, but no obvious obstruction. Home meds include methadone, oxycodone.   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 CT scan showed large stool burden, but no obvious obstruction. Home meds include methadone, oxycodone. Finally good results after enemas, mag citrate. Will discharge with softeners, suppository, fleets enema and increase home lactulose dose. Recommend maintaining hydration.   BPH: stable - Continue Flomax and Proscar  Depression: -Amitriptyline  GERD: -Protonix  Seizure. No seizure activity during this  hospitalization. -Seizure precaution -When necessary Ativan for seizure -Continue Home medications:Depakote -Patient is also on gabapentin  AKI (acute kidney injury) (HCC): mild. Resolved at discharge.    Procedures:  Consultations:    Discharge Exam: Vitals:   10/25/18 2108 10/26/18 0535  BP: (!) 156/74 113/63  Pulse: 86 69  Resp: 18 16  Temp: (!) 97.4 F (36.3 C) 98.2 F (36.8 C)  SpO2: 98% 97%    General: awake alert oriented x3 Cardiovascular: rrr no mgr no le edema Respiratory: normal effort BS clear bilaterally Abdomen: soft +BS no guarding or rebounding  Discharge Instructions   Discharge Instructions    Diet - low sodium heart healthy   Complete by:  As directed    Discharge instructions   Complete by:  As directed    Bowel regimen-- may be trial and error to find the right mix of medications Stay hydrated   Increase activity slowly   Complete by:  As directed      Allergies as of 10/26/2018      Reactions   Haldol [haloperidol] Anaphylaxis, Swelling   Throat swells   Morphine And Related Other (See Comments)   Hallucinations   Neosporin [neomycin-bacitracin Zn-polymyx] Other (See Comments)   Ate a "hole" in skin at application site      Medication List    STOP taking these medications   ciprofloxacin 250 MG tablet Commonly known as:  CIPRO   metroNIDAZOLE 500 MG tablet Commonly known as:  FLAGYL     TAKE these medications   acetaminophen 325 MG tablet Commonly known as:  TYLENOL Take 650 mg by mouth 2 (two) times daily. MORNING and EVENING   amitriptyline 50 MG tablet Commonly known as:  ELAVIL Take 150 mg by mouth at  bedtime.   aspirin EC 81 MG tablet Take 81 mg by mouth at bedtime.   divalproex 500 MG 24 hr tablet Commonly known as:  DEPAKOTE ER Take 1,000 mg by mouth daily. What changed:  Another medication with the same name was removed. Continue taking this medication, and follow the directions you see here.    finasteride 5 MG tablet Commonly known as:  PROSCAR Take 5 mg by mouth daily.   gabapentin 300 MG capsule Commonly known as:  NEURONTIN Take 900 mg by mouth at bedtime.   glycerin adult 2 g suppository Place 1 suppository rectally as needed for constipation.   lactulose 10 GM/15ML solution Commonly known as:  CHRONULAC Take 45 mLs (30 g total) by mouth daily as needed for mild constipation or moderate constipation. What changed:  how much to take   magnesium citrate Soln Take 296 mLs (1 Bottle total) by mouth once as needed for up to 1 dose for severe constipation. What changed:    when to take this  reasons to take this   methadone 5 MG tablet Commonly known as:  DOLOPHINE Take 5 mg by mouth 3 (three) times daily. MORNING/MIDDAY/EVENING   omeprazole 20 MG capsule Commonly known as:  PRILOSEC Take 20 mg by mouth at bedtime.   oxyCODONE 5 MG immediate release tablet Commonly known as:  Oxy IR/ROXICODONE Take 5 mg by mouth every 6 (six) hours as needed for severe pain or breakthrough pain (not relieved by Tylenol or Methadone).   sennosides-docusate sodium 8.6-50 MG tablet Commonly known as:  SENOKOT-S Take 2 tablets by mouth 2 (two) times daily.   sodium phosphate 7-19 GM/118ML Enem Place 133 mLs (1 enema total) rectally daily as needed for severe constipation.   tamsulosin 0.4 MG Caps capsule Commonly known as:  FLOMAX Take 2 capsules (0.8 mg total) by mouth at bedtime.   traZODone 50 MG tablet Commonly known as:  DESYREL Take 50 mg by mouth at bedtime.   vitamin B-12 1000 MCG tablet Commonly known as:  CYANOCOBALAMIN Take 1,000 mcg by mouth daily.   Vitamin D-3 25 MCG (1000 UT) Caps Take 1,000 Units by mouth daily.      Allergies  Allergen Reactions  . Haldol [Haloperidol] Anaphylaxis and Swelling    Throat swells   . Morphine And Related Other (See Comments)    Hallucinations   . Neosporin [Neomycin-Bacitracin Zn-Polymyx] Other (See Comments)     Ate a "hole" in skin at application site   Follow-up Information    PCP At [facility] Follow up.            The results of significant diagnostics from this hospitalization (including imaging, microbiology, ancillary and laboratory) are listed below for reference.    Significant Diagnostic Studies: Ct Abdomen Pelvis W Contrast  Result Date: 10/24/2018 CLINICAL DATA:  Abdominal pain, 4 days, constipation, history of bowel obstruction EXAM: CT ABDOMEN AND PELVIS WITH CONTRAST TECHNIQUE: Multidetector CT imaging of the abdomen and pelvis was performed using the standard protocol following bolus administration of intravenous contrast. CONTRAST:  OMNIPAQUE IOHEXOL 300 MG/ML  SOLN COMPARISON:  03/05/2016 FINDINGS: Lower chest: And like scarring or atelectasis of the included bilateral lung bases. Hepatobiliary: No focal liver abnormality is seen. No gallstones, gallbladder wall thickening, or biliary dilatation. Pancreas: Unremarkable. No pancreatic ductal dilatation or surrounding inflammatory changes. Spleen: Normal in size without focal abnormality. Adrenals/Urinary Tract: Adrenal glands are unremarkable. Kidneys are normal, without renal calculi, focal lesion, or hydronephrosis. Bladder is unremarkable.  Stomach/Bowel: Stomach is within normal limits. The patient is status post prior partial right colon resection. The distal small bowel is fluid-filled although not overtly distended. There is a very large burden of stool in stool balls in the distal colon without evidence of proximal obstruction. Vascular/Lymphatic: Calcific atherosclerosis. No enlarged abdominal or pelvic lymph nodes. Reproductive: No mass or other abnormality. Other: No abdominal wall hernia or abnormality. No abdominopelvic ascites. Musculoskeletal: No acute or significant osseous findings. IMPRESSION: 1. The patient is status post prior partial right colon resection. The distal small bowel is fluid-filled although not overtly  distended. There is a very large burden of stool in stool balls in the distal colon without evidence of proximal obstruction. Bowel transit may be evaluated by contrast follow-up if there is concern for obstruction. 2.  Chronic and incidental findings as detailed above. Electronically Signed   By: Lauralyn Primes M.D.   On: 10/24/2018 15:55   Dg Abd Portable 1v  Result Date: 10/25/2018 CLINICAL DATA:  68 year old male with abdominal pain. Prior partial right colon resection. EXAM: PORTABLE ABDOMEN - 1 VIEW COMPARISON:  CT Abdomen and Pelvis yesterday and earlier. FINDINGS: Portable AP supine view at 1602 hours. The stable bowel gas pattern with large volume of retained stool from the splenic flexure distally. No dilated small bowel. Staple lines redemonstrated in the right abdomen. Small round retained metal foreign body again projects over the right sacrum. No acute osseous abnormality identified. IMPRESSION: Stable bowel gas pattern and retained stool since the CT yesterday. Electronically Signed   By: Odessa Fleming M.D.   On: 10/25/2018 16:19    Microbiology: No results found for this or any previous visit (from the past 240 hour(s)).   Labs: Basic Metabolic Panel: Recent Labs  Lab 10/24/18 1331 10/25/18 0210 10/26/18 0305  NA 140 141 141  K 4.2 4.1 4.3  CL 103 105 104  CO2 28 29 25   GLUCOSE 96 90 89  BUN 18 22 12   CREATININE 1.40* 1.20 1.01  CALCIUM 9.2 8.8* 8.5*   Liver Function Tests: Recent Labs  Lab 10/24/18 1331  AST 21  ALT 18  ALKPHOS 55  BILITOT 0.4  PROT 6.7  ALBUMIN 3.6   Recent Labs  Lab 10/24/18 1331  LIPASE 19   No results for input(s): AMMONIA in the last 168 hours. CBC: Recent Labs  Lab 10/24/18 1331 10/25/18 0210 10/26/18 0305  WBC 11.1* 10.9* 6.9  HGB 12.3* 11.5* 10.6*  HCT 39.3 35.3* 32.7*  MCV 95.4 93.4 93.7  PLT 150 150 142*   Cardiac Enzymes: No results for input(s): CKTOTAL, CKMB, CKMBINDEX, TROPONINI in the last 168 hours. BNP: BNP (last 3  results) No results for input(s): BNP in the last 8760 hours.  ProBNP (last 3 results) No results for input(s): PROBNP in the last 8760 hours.  CBG: No results for input(s): GLUCAP in the last 168 hours.     Signed:  Gwenyth Bender NP [facility] 10/26/2018, 12:44 PM  Patient was seen, examined,treatment plan was discussed with the Advance Practice Provider.  I have personally reviewed the clinical findings, labs, EKG, imaging studies and management of this patient in detail. I have also reviewed the orders written for this patient which were under my direction. I agree with the documentation, as recorded by the Advance Practice Provider.   DMETRI ACRI is a 68 y.o. male who was here with severe constipation.  Responded well to SMOG enema and bowel prep.  ON chronic narcotics with  multiple abdominal surgeries.  Needs bowel regimen, improved diet (fiber) and hydration. On exam stomach is soft and NT.-- much improved from yesterday  Joseph Art, DO  How to contact the [facility] Attending or Consulting provider 7A - 7P or covering provider during after hours 7P -7A, for this patient?  1. Check the care team in [facility] and look for a) attending/consulting [facility] provider listed and b) the [facility] team listed 2. Log into [email or web address] and use [place]'s universal password to access. If you do not have the password, please contact the hospital operator. 3. Locate the [facility] provider you are looking for under [facility] and page to a number that you can be directly reached. 4. If you still have difficulty reaching the provider, please page the [facility] (Director on Call) for the Hospitalists listed on amion for assistance.

## 2019-10-01 IMAGING — CT CT ABD-PELV W/ CM
2 of 5 series · 16 of 46 positions shown, 18 images · IV contrast (APPLIED)
Comparison: 03/05/2016

CLINICAL DATA: Abdominal pain, 4 days, constipation, history of
bowel obstruction

EXAM:
CT ABDOMEN AND PELVIS WITH CONTRAST
TECHNIQUE: Multidetector CT imaging of the abdomen and pelvis was performed
using the standard protocol following bolus administration of
intravenous contrast.
CONTRAST:  100mL OMNIPAQUE IOHEXOL 300 MG/ML  SOLN

[Series 3: abdomen 5.0 · axial · 0.79mm/px · z∈[+606,+1006]mm · 13 of 90 slices shown, 15 images]
[im 5/90  soft-tissue]
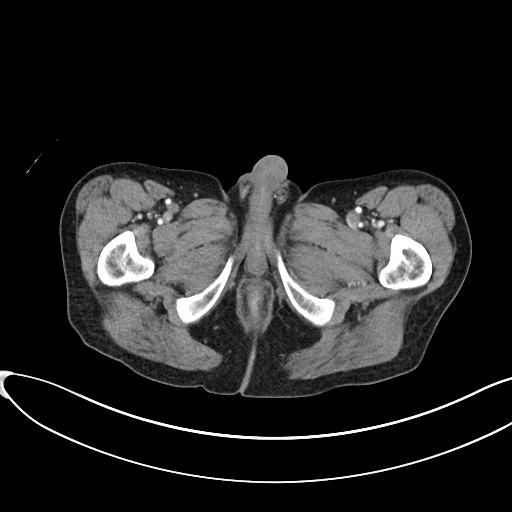
[im 5/90  bone]
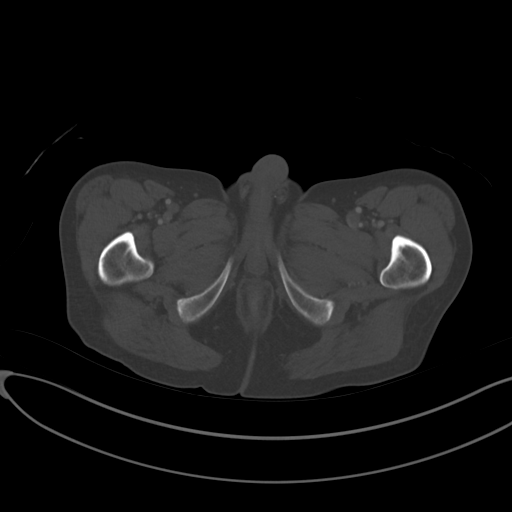
[im 10/90  soft-tissue]
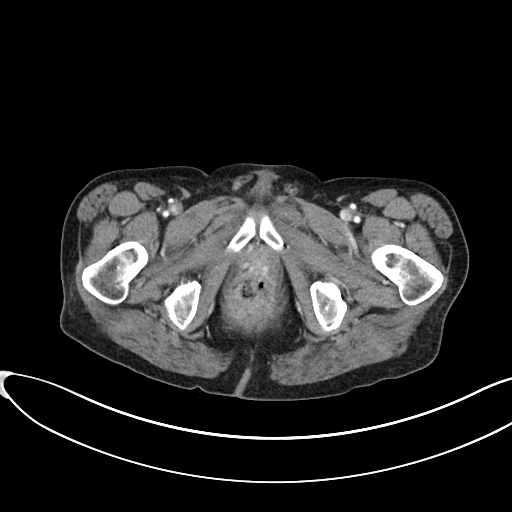
[im 20/90  soft-tissue]
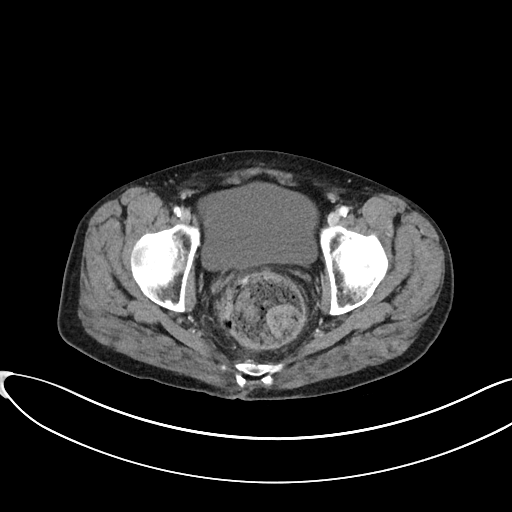
[im 25/90  soft-tissue]
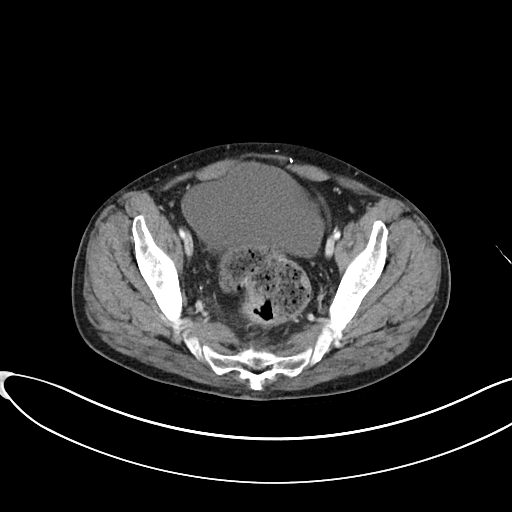
[im 30/90  soft-tissue]
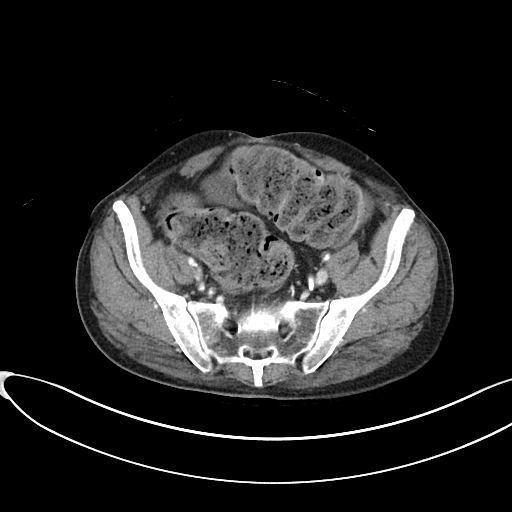
[im 40/90  soft-tissue]
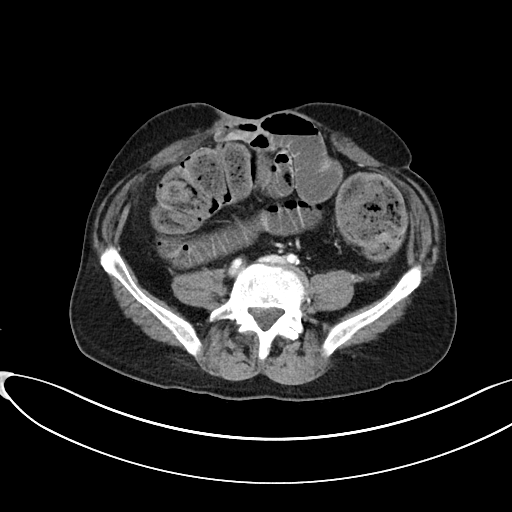
[im 45/90  soft-tissue]
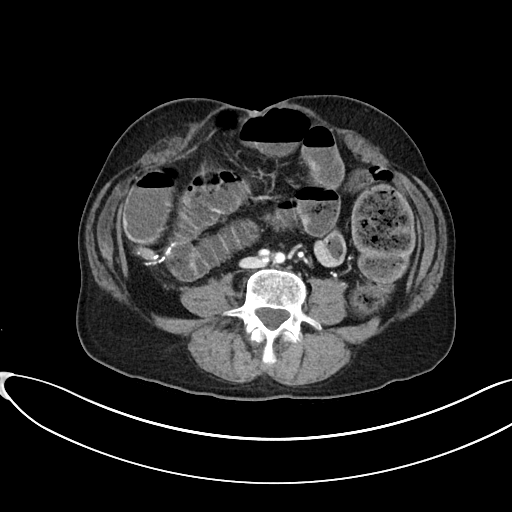
[im 50/90  soft-tissue]
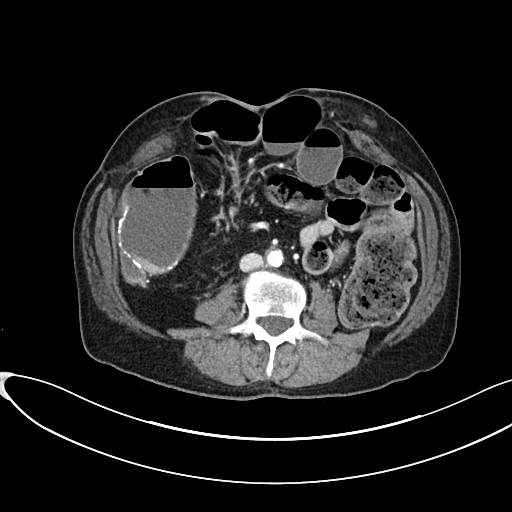
[im 60/90  soft-tissue]
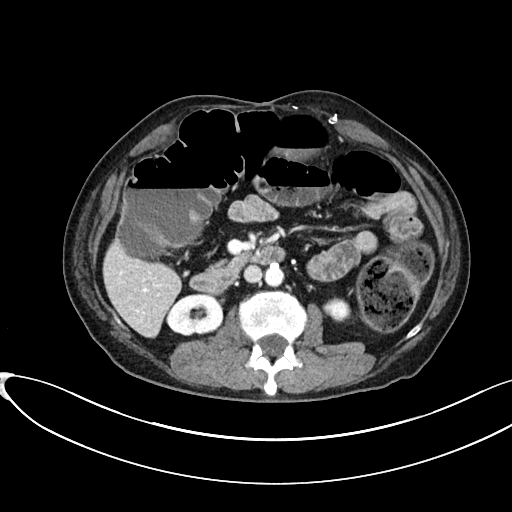
[im 60/90  bone]
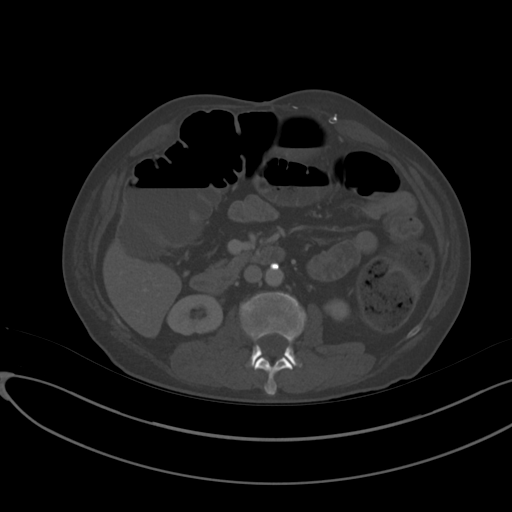
[im 65/90  soft-tissue]
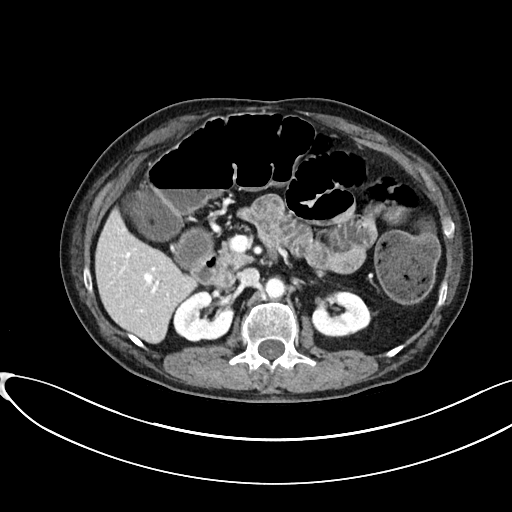
[im 70/90  soft-tissue]
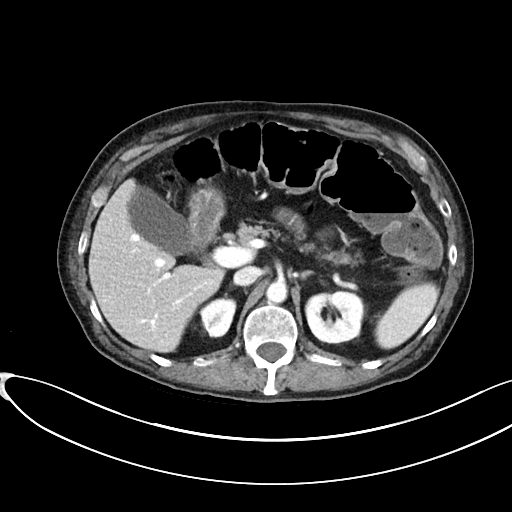
[im 80/90  soft-tissue]
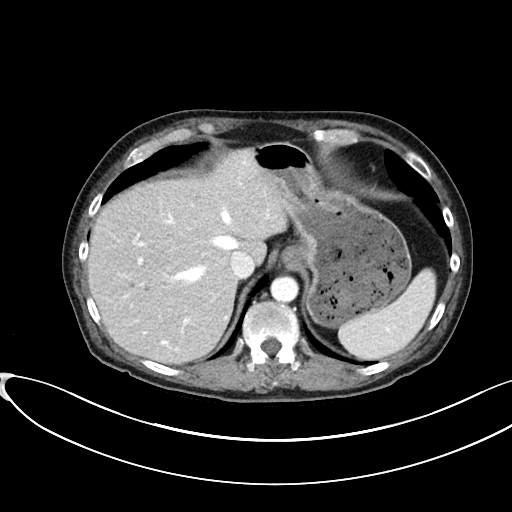
[im 85/90  soft-tissue]
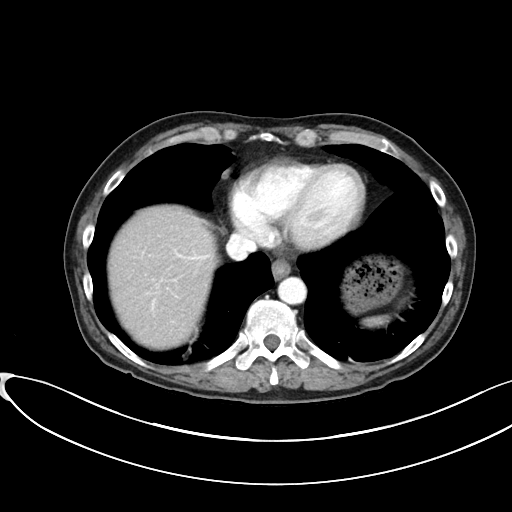

[Series 6: abdomen 3.0 mpr cor · coronal · 0.68mm/px · 3 of 101 slices shown]
[im 34/101  soft-tissue]
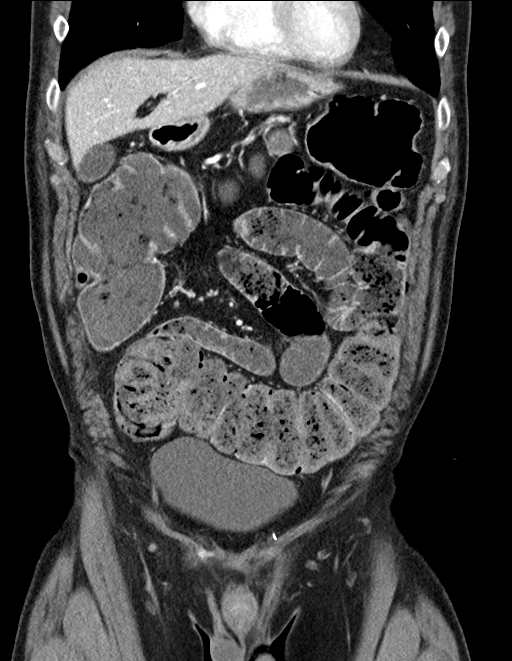
[im 45/101  soft-tissue]
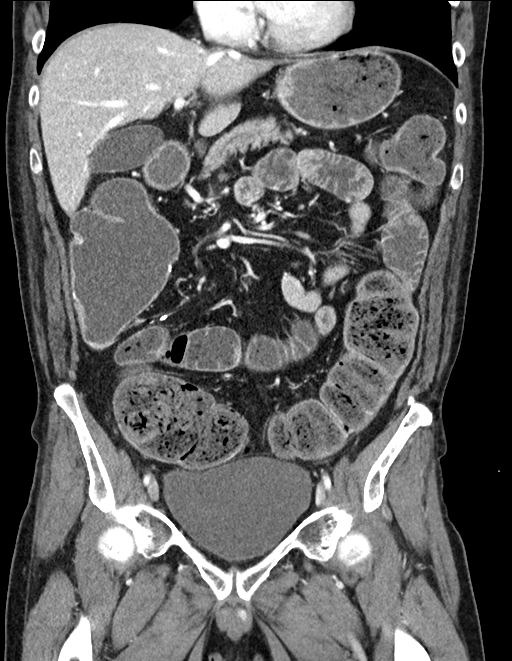
[im 56/101  soft-tissue]
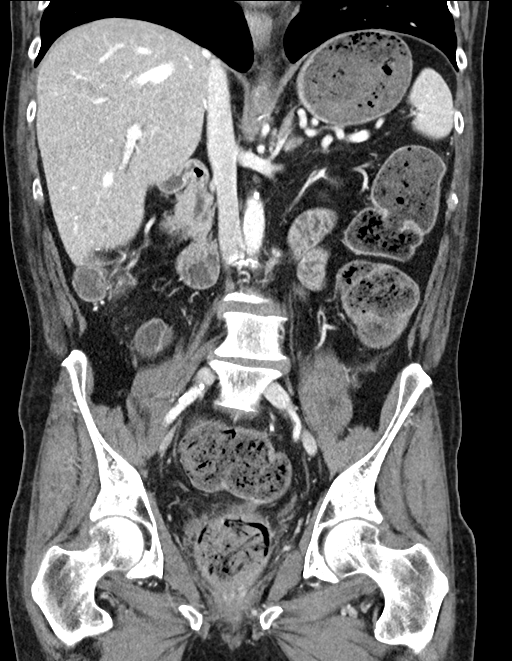

[16 of 46 positions shown; findings below may reference images not displayed]

FINDINGS: Lower chest: And like scarring or atelectasis of the included
bilateral lung bases.

Hepatobiliary: No focal liver abnormality is seen. No gallstones,
gallbladder wall thickening, or biliary dilatation.

Pancreas: Unremarkable. No pancreatic ductal dilatation or
surrounding inflammatory changes.

Spleen: Normal in size without focal abnormality.

Adrenals/Urinary Tract: Adrenal glands are unremarkable. Kidneys are
normal, without renal calculi, focal lesion, or hydronephrosis.
Bladder is unremarkable.

Stomach/Bowel: Stomach is within normal limits. The patient is
status post prior partial right colon resection. The distal small
bowel is fluid-filled although not overtly distended. There is a
very large burden of stool in stool balls in the distal colon
without evidence of proximal obstruction.

Vascular/Lymphatic: Calcific atherosclerosis. No enlarged abdominal
or pelvic lymph nodes.

Reproductive: No mass or other abnormality.

Other: No abdominal wall hernia or abnormality. No abdominopelvic
ascites.

Musculoskeletal: No acute or significant osseous findings.
IMPRESSION: 1. The patient is status post prior partial right colon resection.
The distal small bowel is fluid-filled although not overtly
distended. There is a very large burden of stool in stool balls in
the distal colon without evidence of proximal obstruction. Bowel
transit may be evaluated by contrast follow-up if there is concern
for obstruction.

2.  Chronic and incidental findings as detailed above.

## 2019-10-02 IMAGING — DX DG ABD PORTABLE 1V
1 series · 1 of 1 positions shown · non-contrast
Comparison: CT Abdomen and Pelvis yesterday and earlier.

CLINICAL DATA: 67-year-old male with abdominal pain. Prior partial
right colon resection.

EXAM:
PORTABLE ABDOMEN - 1 VIEW

[abdomen]
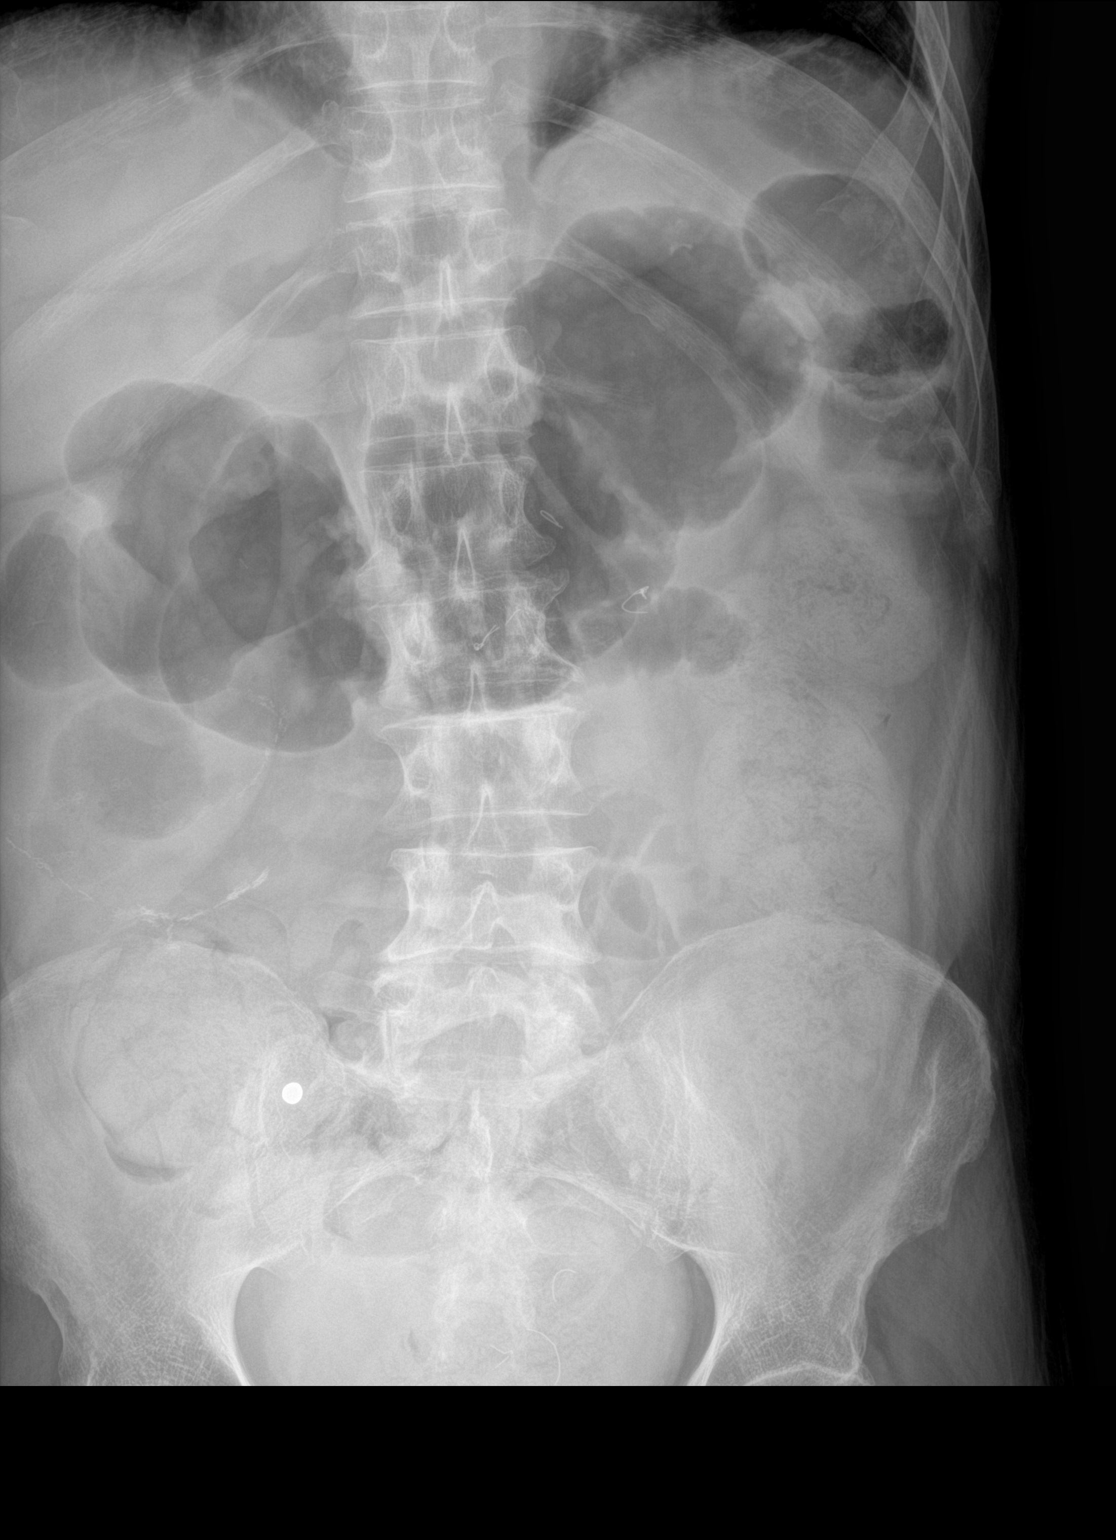

[1 of 1 positions shown; findings below may reference images not displayed]

FINDINGS: Portable AP supine view at 6436 hours. The stable bowel gas pattern
with large volume of retained stool from the splenic flexure
distally. No dilated small bowel. Staple lines redemonstrated in the
right abdomen. Small round retained metal foreign body again
projects over the right sacrum. No acute osseous abnormality
identified.
IMPRESSION: Stable bowel gas pattern and retained stool since the CT yesterday.

## 2024-04-18 ENCOUNTER — Emergency Department (HOSPITAL_COMMUNITY)

## 2024-04-18 ENCOUNTER — Other Ambulatory Visit: Payer: Self-pay

## 2024-04-18 ENCOUNTER — Encounter (HOSPITAL_COMMUNITY): Payer: Self-pay

## 2024-04-18 ENCOUNTER — Emergency Department (HOSPITAL_COMMUNITY)
Admission: EM | Admit: 2024-04-18 | Discharge: 2024-04-18 | Disposition: A | Discharge status: Home / Self Care | Attending: Emergency Medicine | Admitting: Emergency Medicine

## 2024-04-18 DIAGNOSIS — R3589 Other polyuria: Secondary | ICD-10-CM | POA: Diagnosis not present

## 2024-04-18 DIAGNOSIS — R4189 Other symptoms and signs involving cognitive functions and awareness: Secondary | ICD-10-CM | POA: Diagnosis not present

## 2024-04-18 DIAGNOSIS — R7989 Other specified abnormal findings of blood chemistry: Secondary | ICD-10-CM | POA: Diagnosis not present

## 2024-04-18 DIAGNOSIS — R1084 Generalized abdominal pain: Secondary | ICD-10-CM | POA: Insufficient documentation

## 2024-04-18 DIAGNOSIS — R531 Weakness: Secondary | ICD-10-CM | POA: Insufficient documentation

## 2024-04-18 DIAGNOSIS — R1030 Lower abdominal pain, unspecified: Secondary | ICD-10-CM | POA: Diagnosis present

## 2024-04-18 DIAGNOSIS — F0390 Unspecified dementia without behavioral disturbance: Secondary | ICD-10-CM | POA: Insufficient documentation

## 2024-04-18 DIAGNOSIS — N390 Urinary tract infection, site not specified: Secondary | ICD-10-CM

## 2024-04-18 DIAGNOSIS — Z7982 Long term (current) use of aspirin: Secondary | ICD-10-CM | POA: Insufficient documentation

## 2024-04-18 HISTORY — DX: Unspecified dementia, unspecified severity, without behavioral disturbance, psychotic disturbance, mood disturbance, and anxiety: F03.90

## 2024-04-18 LAB — CBC WITH DIFFERENTIAL/PLATELET
Abs Immature Granulocytes: 0.05 K/uL (ref 0.00–0.07)
Basophils Absolute: 0 K/uL (ref 0.0–0.1)
Basophils Relative: 0 %
Eosinophils Absolute: 0.1 K/uL (ref 0.0–0.5)
Eosinophils Relative: 1 %
HCT: 38.1 % — ABNORMAL LOW (ref 39.0–52.0)
Hemoglobin: 12.6 g/dL — ABNORMAL LOW (ref 13.0–17.0)
Immature Granulocytes: 1 %
Lymphocytes Relative: 14 %
Lymphs Abs: 1 K/uL (ref 0.7–4.0)
MCH: 31.8 pg (ref 26.0–34.0)
MCHC: 33.1 g/dL (ref 30.0–36.0)
MCV: 96.2 fL (ref 80.0–100.0)
Monocytes Absolute: 0.5 K/uL (ref 0.1–1.0)
Monocytes Relative: 7 %
Neutro Abs: 5.8 K/uL (ref 1.7–7.7)
Neutrophils Relative %: 77 %
Platelets: 130 K/uL — ABNORMAL LOW (ref 150–400)
RBC: 3.96 MIL/uL — ABNORMAL LOW (ref 4.22–5.81)
RDW: 13.8 % (ref 11.5–15.5)
WBC: 7.5 K/uL (ref 4.0–10.5)
nRBC: 0 % (ref 0.0–0.2)

## 2024-04-18 LAB — URINALYSIS, ROUTINE W REFLEX MICROSCOPIC
Bilirubin Urine: NEGATIVE
Glucose, UA: NEGATIVE mg/dL
Ketones, ur: 20 mg/dL — AB
Nitrite: POSITIVE — AB
Protein, ur: 30 mg/dL — AB
RBC / HPF: >50 RBC/hpf (ref 0–5)
Specific Gravity, Urine: 1.024 (ref 1.005–1.030)
pH: 6 (ref 5.0–8.0)

## 2024-04-18 LAB — COMPREHENSIVE METABOLIC PANEL WITH GFR
ALT: 12 U/L (ref 0–44)
AST: 18 U/L (ref 15–41)
Albumin: 2.7 g/dL — ABNORMAL LOW (ref 3.5–5.0)
Alkaline Phosphatase: 46 U/L (ref 38–126)
Anion gap: 12 (ref 5–15)
BUN: 13 mg/dL (ref 8–23)
CO2: 21 mmol/L — ABNORMAL LOW (ref 22–32)
Calcium: 8.3 mg/dL — ABNORMAL LOW (ref 8.9–10.3)
Chloride: 107 mmol/L (ref 98–111)
Creatinine, Ser: 1.45 mg/dL — ABNORMAL HIGH (ref 0.61–1.24)
GFR, Estimated: 51 mL/min — ABNORMAL LOW (ref >60)
Glucose, Bld: 126 mg/dL — ABNORMAL HIGH (ref 70–99)
Potassium: 3.8 mmol/L (ref 3.5–5.1)
Sodium: 140 mmol/L (ref 135–145)
Total Bilirubin: 0.8 mg/dL (ref 0.0–1.2)
Total Protein: 5.9 g/dL — ABNORMAL LOW (ref 6.5–8.1)

## 2024-04-18 MED ORDER — CEPHALEXIN 500 MG PO CAPS: 500.0000 mg | ORAL_CAPSULE | Freq: Four times a day (QID) | ORAL | 0 refills | Status: AC

## 2024-04-18 MED ORDER — SODIUM CHLORIDE 0.9 % IV BOLUS
1000.0000 mL | Freq: Once | INTRAVENOUS | Status: AC
  Administered 2024-04-18: 1000 mL via INTRAVENOUS

## 2024-04-18 MED ORDER — SODIUM CHLORIDE 0.9 % IV SOLN
2.0000 g | Freq: Once | INTRAVENOUS | Status: AC
  Administered 2024-04-18: 2 g via INTRAVENOUS
  Filled 2024-04-18: qty 20

## 2024-04-18 MED ORDER — LIDOCAINE HCL URETHRAL/MUCOSAL 2 % EX GEL
1.0000 | Freq: Once | CUTANEOUS | Status: DC
  Filled 2024-04-18: qty 5

## 2024-04-18 NOTE — Discharge Instructions (Signed)
 Follow-up with your doctor next week for recheck

## 2024-04-18 NOTE — ED Provider Notes (Signed)
 [place] [facility] DEPARTMENT AT [facility] Provider Note   CSN: [id number] Arrival date & time: 04/18/24  1255     Patient presents with: Urinary Tract Infection   Jonathon Turner is a 73 y.o. male.   HPI Presents with his wife and daughter who provide the history.  Patient is a Cytogeneticist, has cognitive impairment and/or dementia.  Has a history of shrapnel injury sustained in [place], that required multiple surgeries and has had complications intermittently since that time.  Family notes that in the past 2 or 3 days patient has been less interactive than usual, has complained of polyuria no dysuria.  He seems to indicate family members that he has lower abdominal pain intermittently, though not consistently.  Patient typically gets care at the [facility] in [place], but lives close to this facility. Level 5 caveat secondary to cognitive impairment    Prior to Admission medications   Medication Sig Start Date End Date Taking? Authorizing Provider  divalproex  (DEPAKOTE  ER) 500 MG 24 hr tablet Take 1,000 mg by mouth daily.    Yes [provider]  methadone  (DOLOPHINE ) 5 MG tablet Take 5 mg by mouth 3 (three) times daily. MORNING/MIDDAY/EVENING   Yes [provider]  omeprazole (PRILOSEC) 20 MG capsule Take 20 mg by mouth at bedtime.    Yes [provider]  acetaminophen  (TYLENOL ) 325 MG tablet Take 650 mg by mouth 2 (two) times daily. MORNING and EVENING    [provider]  amitriptyline  (ELAVIL ) 50 MG tablet Take 150 mg by mouth at bedtime.     [provider]  aspirin  EC 81 MG tablet Take 81 mg by mouth at bedtime.     [provider]  Cholecalciferol  (VITAMIN D -3) 1000 units CAPS Take 1,000 Units by mouth daily.    [provider]  finasteride  (PROSCAR ) 5 MG tablet Take 5 mg by mouth daily.    [provider]  gabapentin  (NEURONTIN ) 300 MG capsule Take 900 mg by mouth at bedtime.     [provider]  glycerin  adult 2 g suppository Place 1 suppository rectally as needed for constipation. 10/26/18   Black, Darice HERO, NP  lactulose  (CHRONULAC ) 10 GM/15ML solution Take 45 mLs (30 g total) by mouth daily as needed for mild constipation or moderate constipation. 10/26/18   Black, Darice HERO, NP  magnesium  citrate SOLN Take 296 mLs (1 Bottle total) by mouth once as needed for up to 1 dose for severe constipation. 10/26/18   Vann, Jessica U, DO  oxyCODONE  (OXY IR/ROXICODONE ) 5 MG immediate release tablet Take 5 mg by mouth every 6 (six) hours as needed for severe pain or breakthrough pain (not relieved by Tylenol  or Methadone ).    [provider]  sennosides-docusate sodium  (SENOKOT-S) 8.6-50 MG tablet Take 2 tablets by mouth 2 (two) times daily.    [provider]  sodium phosphate (FLEET) 7-19 GM/118ML ENEM Place 133 mLs (1 enema total) rectally daily as needed for severe constipation. 10/26/18   Black, Darice HERO, NP  tamsulosin  (FLOMAX ) 0.4 MG CAPS capsule Take 2 capsules (0.8 mg total) by mouth at bedtime. 10/26/18   Black, Darice HERO, NP  traZODone  (DESYREL ) 50 MG tablet Take 50 mg by mouth at bedtime.    [provider]  vitamin B-12 (CYANOCOBALAMIN ) 1000 MCG tablet Take 1,000 mcg by mouth daily.    [provider]    Allergies: Haldol [haloperidol], Morphine and codeine, and Neosporin [neomycin-bacitracin zn-polymyx]  Review of Systems  Updated Vital Signs BP 108/62   Pulse 69   Temp 98 F (36.7 C) (Axillary)   Resp 16   Ht 5' 6 (1.676 m)   Wt 58.1 kg   SpO2 95%   BMI 20.66 kg/m   Physical Exam Vitals and nursing note reviewed.  Constitutional:      General: He is not in acute distress.    Appearance: He is well-developed.  HENT:     Head: Normocephalic and atraumatic.  Eyes:     Conjunctiva/sclera: Conjunctivae normal.  Cardiovascular:     Rate and Rhythm: Normal rate and regular rhythm.  Pulmonary:     Effort: Pulmonary effort is normal.  No respiratory distress.     Breath sounds: No stridor.  Abdominal:     General: There is no distension.   Skin:    General: Skin is warm and dry.  Neurological:     Comments: Moves all extremity spontaneously, follows commands somewhat, largely nonverbal  Psychiatric:        Cognition and Memory: Cognition is impaired. Memory is impaired.     (all labs ordered are listed, but only abnormal results are displayed) Labs Reviewed  CBC WITH DIFFERENTIAL/PLATELET - Abnormal; Notable for the following components:      Result Value   RBC 3.96 (*)    Hemoglobin 12.6 (*)    HCT 38.1 (*)    Platelets 130 (*)    All other components within normal limits  COMPREHENSIVE METABOLIC PANEL WITH GFR - Abnormal; Notable for the following components:   CO2 21 (*)    Glucose, Bld 126 (*)    Creatinine, Ser 1.45 (*)    Calcium 8.3 (*)    Total Protein 5.9 (*)    Albumin 2.7 (*)    GFR, Estimated 51 (*)    All other components within normal limits  URINALYSIS, ROUTINE W REFLEX MICROSCOPIC    EKG: None  Radiology: No results found.   Procedures   Medications Ordered in the ED - No data to display                                  Medical Decision Making Patient presents with questionable lower abdominal pain, polyuria, decreased interactivity according to family members.  Broad differential including dehydration, progression of cognitive impairment, urinary tract infection, bowel obstruction, constipation, particular given patient's history of this on records from this facility 5 years ago. Cardiac 70 sinus normal pulse ox 100% room air normal CT, labs ordered.  Amount and/or Complexity of Data Reviewed Independent Historian: caregiver External Data Reviewed: notes.    Details: Notes from 5 years ago with admission Labs: ordered. Decision-making details documented in ED Course. Radiology: ordered and independent interpretation performed. Decision-making details documented in ED  Course.  Risk Prescription drug management. Decision regarding hospitalization. Diagnosis or treatment significantly limited by social determinants of health.   Initial labs notable for mild elevation in creatinine, the BUN is 13.  CT pending, urinalysis pending on sign out     Final diagnoses:  Weakness  Generalized abdominal pain     Garrick Charleston, MD 04/18/24 1542

## 2024-04-18 NOTE — ED Triage Notes (Signed)
 Pt arrived via POV with family who reports the Pt has been experiencing increased restlessness, and possibly has a UTI. Per family, Pt has dementia, and has been using the bathroom more frequently.

## 2024-04-18 NOTE — ED Provider Notes (Signed)
 Urinalysis suggest UTI.  Family wants to take the patient home to be followed up at the [facility].  We will start him on some Keflex  and he was given Rocephin .  A urine culture was done   Suzette Pac, MD 04/18/24 2002

## 2024-04-18 NOTE — ED Notes (Signed)
 Multiple failed attempts by 3 nurses including a coude catheter. PT has hx of BPH. MD notified. No new orders at this time.

## 2024-04-20 LAB — URINE CULTURE: Culture: <10000 — AB
